# Patient Record
Sex: Female | Born: 1970 | Race: White | Hispanic: No | Smoking: Former smoker
Health system: Southern US, Community
[De-identification: ages and names within clinical notes are randomized; demographics above are authoritative.]

## PROBLEM LIST (undated history)

## (undated) DIAGNOSIS — R569 Unspecified convulsions: Secondary | ICD-10-CM

## (undated) DIAGNOSIS — C50919 Malignant neoplasm of unspecified site of unspecified female breast: Secondary | ICD-10-CM

## (undated) DIAGNOSIS — F32A Depression, unspecified: Secondary | ICD-10-CM

## (undated) DIAGNOSIS — F419 Anxiety disorder, unspecified: Secondary | ICD-10-CM

## (undated) DIAGNOSIS — R51 Headache: Secondary | ICD-10-CM

## (undated) DIAGNOSIS — D649 Anemia, unspecified: Secondary | ICD-10-CM

## (undated) DIAGNOSIS — R112 Nausea with vomiting, unspecified: Secondary | ICD-10-CM

## (undated) DIAGNOSIS — Z9889 Other specified postprocedural states: Secondary | ICD-10-CM

## (undated) DIAGNOSIS — T4145XA Adverse effect of unspecified anesthetic, initial encounter: Secondary | ICD-10-CM

## (undated) DIAGNOSIS — K219 Gastro-esophageal reflux disease without esophagitis: Secondary | ICD-10-CM

## (undated) DIAGNOSIS — T8859XA Other complications of anesthesia, initial encounter: Secondary | ICD-10-CM

## (undated) DIAGNOSIS — F329 Major depressive disorder, single episode, unspecified: Secondary | ICD-10-CM

## (undated) DIAGNOSIS — N6452 Nipple discharge: Secondary | ICD-10-CM

## (undated) HISTORY — PX: COLONOSCOPY: SHX174

## (undated) HISTORY — DX: Nipple discharge: N64.52

## (undated) HISTORY — DX: Unspecified convulsions: R56.9

## (undated) HISTORY — PX: UPPER GASTROINTESTINAL ENDOSCOPY: SHX188

## (undated) HISTORY — PX: MASTECTOMY: SHX3

## (undated) HISTORY — DX: Depression, unspecified: F32.A

## (undated) HISTORY — PX: ABDOMINAL HYSTERECTOMY: SHX81

## (undated) HISTORY — DX: Malignant neoplasm of unspecified site of unspecified female breast: C50.919

## (undated) HISTORY — PX: OTHER SURGICAL HISTORY: SHX169

## (undated) HISTORY — DX: Anxiety disorder, unspecified: F41.9

## (undated) HISTORY — DX: Major depressive disorder, single episode, unspecified: F32.9

## (undated) HISTORY — DX: Headache: R51

---

## 1991-06-04 HISTORY — PX: TUBAL LIGATION: SHX77

## 2011-11-28 ENCOUNTER — Other Ambulatory Visit: Payer: Self-pay | Admitting: Family Medicine

## 2011-11-28 DIAGNOSIS — N6313 Unspecified lump in the right breast, lower outer quadrant: Secondary | ICD-10-CM

## 2011-11-28 DIAGNOSIS — Z803 Family history of malignant neoplasm of breast: Secondary | ICD-10-CM

## 2011-12-04 ENCOUNTER — Ambulatory Visit
Admission: RE | Admit: 2011-12-04 | Discharge: 2011-12-04 | Disposition: A | Discharge status: Home / Self Care | Payer: No Typology Code available for payment source | Source: Ambulatory Visit | Attending: Family Medicine | Admitting: Family Medicine

## 2011-12-04 ENCOUNTER — Other Ambulatory Visit: Payer: Self-pay | Admitting: Family Medicine

## 2011-12-04 DIAGNOSIS — N6313 Unspecified lump in the right breast, lower outer quadrant: Secondary | ICD-10-CM

## 2011-12-04 DIAGNOSIS — Z803 Family history of malignant neoplasm of breast: Secondary | ICD-10-CM

## 2011-12-19 ENCOUNTER — Other Ambulatory Visit: Payer: Self-pay | Admitting: Obstetrics and Gynecology

## 2011-12-19 DIAGNOSIS — Z803 Family history of malignant neoplasm of breast: Secondary | ICD-10-CM

## 2011-12-19 DIAGNOSIS — N6313 Unspecified lump in the right breast, lower outer quadrant: Secondary | ICD-10-CM

## 2012-01-01 ENCOUNTER — Ambulatory Visit
Admission: RE | Admit: 2012-01-01 | Discharge: 2012-01-01 | Disposition: A | Discharge status: Home / Self Care | Payer: No Typology Code available for payment source | Source: Ambulatory Visit | Attending: Obstetrics and Gynecology | Admitting: Obstetrics and Gynecology

## 2012-01-01 ENCOUNTER — Other Ambulatory Visit: Payer: Self-pay

## 2012-01-01 DIAGNOSIS — Z803 Family history of malignant neoplasm of breast: Secondary | ICD-10-CM

## 2012-01-01 DIAGNOSIS — N6313 Unspecified lump in the right breast, lower outer quadrant: Secondary | ICD-10-CM

## 2012-01-01 DIAGNOSIS — N6320 Unspecified lump in the left breast, unspecified quadrant: Secondary | ICD-10-CM

## 2012-01-03 ENCOUNTER — Other Ambulatory Visit: Payer: Self-pay | Admitting: *Deleted

## 2012-01-03 ENCOUNTER — Other Ambulatory Visit: Payer: Self-pay | Admitting: Oncology

## 2012-01-03 DIAGNOSIS — C50419 Malignant neoplasm of upper-outer quadrant of unspecified female breast: Secondary | ICD-10-CM | POA: Insufficient documentation

## 2012-01-03 DIAGNOSIS — C50912 Malignant neoplasm of unspecified site of left female breast: Secondary | ICD-10-CM

## 2012-01-06 ENCOUNTER — Telehealth: Payer: Self-pay | Admitting: *Deleted

## 2012-01-06 NOTE — Telephone Encounter (Signed)
Confirmed [facility] for 01/08/12 at 0830 .  Instructions and contact information given.

## 2012-01-07 ENCOUNTER — Ambulatory Visit (HOSPITAL_COMMUNITY)
Admission: RE | Admit: 2012-01-07 | Discharge: 2012-01-07 | Disposition: A | Discharge status: Home / Self Care | Payer: Medicaid Other | Source: Ambulatory Visit | Attending: Oncology | Admitting: Oncology

## 2012-01-07 ENCOUNTER — Other Ambulatory Visit: Payer: Self-pay | Admitting: Oncology

## 2012-01-07 DIAGNOSIS — Z803 Family history of malignant neoplasm of breast: Secondary | ICD-10-CM | POA: Insufficient documentation

## 2012-01-07 DIAGNOSIS — R928 Other abnormal and inconclusive findings on diagnostic imaging of breast: Secondary | ICD-10-CM

## 2012-01-07 DIAGNOSIS — C50419 Malignant neoplasm of upper-outer quadrant of unspecified female breast: Secondary | ICD-10-CM | POA: Insufficient documentation

## 2012-01-07 DIAGNOSIS — N63 Unspecified lump in unspecified breast: Secondary | ICD-10-CM | POA: Insufficient documentation

## 2012-01-07 DIAGNOSIS — C50912 Malignant neoplasm of unspecified site of left female breast: Secondary | ICD-10-CM

## 2012-01-07 MED ORDER — GADOBENATE DIMEGLUMINE 529 MG/ML IV SOLN
10.0000 mL | Freq: Once | INTRAVENOUS | Status: AC | PRN
  Administered 2012-01-07: 10 mL via INTRAVENOUS

## 2012-01-08 ENCOUNTER — Encounter: Payer: Self-pay | Admitting: Genetic Counselor

## 2012-01-08 ENCOUNTER — Ambulatory Visit (HOSPITAL_BASED_OUTPATIENT_CLINIC_OR_DEPARTMENT_OTHER): Payer: Self-pay | Admitting: Surgery

## 2012-01-08 ENCOUNTER — Ambulatory Visit
Admission: RE | Admit: 2012-01-08 | Discharge: 2012-01-08 | Disposition: A | Discharge status: Home / Self Care | Payer: Medicaid Other | Source: Ambulatory Visit | Attending: Radiation Oncology | Admitting: Radiation Oncology

## 2012-01-08 ENCOUNTER — Ambulatory Visit (HOSPITAL_BASED_OUTPATIENT_CLINIC_OR_DEPARTMENT_OTHER): Payer: Self-pay | Admitting: Oncology

## 2012-01-08 ENCOUNTER — Ambulatory Visit: Payer: Self-pay

## 2012-01-08 ENCOUNTER — Encounter: Payer: Self-pay | Admitting: *Deleted

## 2012-01-08 ENCOUNTER — Ambulatory Visit: Payer: Medicaid Other | Attending: Surgery | Admitting: Physical Therapy

## 2012-01-08 ENCOUNTER — Encounter (INDEPENDENT_AMBULATORY_CARE_PROVIDER_SITE_OTHER): Payer: Self-pay | Admitting: Surgery

## 2012-01-08 ENCOUNTER — Other Ambulatory Visit (HOSPITAL_BASED_OUTPATIENT_CLINIC_OR_DEPARTMENT_OTHER): Payer: Self-pay | Admitting: Lab

## 2012-01-08 ENCOUNTER — Other Ambulatory Visit: Payer: Self-pay | Admitting: Lab

## 2012-01-08 ENCOUNTER — Ambulatory Visit (HOSPITAL_BASED_OUTPATIENT_CLINIC_OR_DEPARTMENT_OTHER): Payer: Self-pay | Admitting: Genetic Counselor

## 2012-01-08 VITALS — BP 97/63 | HR 86 | Temp 98.6°F | Resp 20 | Ht 62.0 in | Wt 115.0 lb

## 2012-01-08 VITALS — BP 97/63 | HR 80 | Temp 98.9°F | Resp 20 | Ht 62.0 in | Wt 115.1 lb

## 2012-01-08 DIAGNOSIS — R293 Abnormal posture: Secondary | ICD-10-CM | POA: Insufficient documentation

## 2012-01-08 DIAGNOSIS — Z170 Estrogen receptor positive status [ER+]: Secondary | ICD-10-CM

## 2012-01-08 DIAGNOSIS — C50419 Malignant neoplasm of upper-outer quadrant of unspecified female breast: Secondary | ICD-10-CM

## 2012-01-08 DIAGNOSIS — M25619 Stiffness of unspecified shoulder, not elsewhere classified: Secondary | ICD-10-CM | POA: Insufficient documentation

## 2012-01-08 DIAGNOSIS — C50919 Malignant neoplasm of unspecified site of unspecified female breast: Secondary | ICD-10-CM | POA: Insufficient documentation

## 2012-01-08 DIAGNOSIS — F411 Generalized anxiety disorder: Secondary | ICD-10-CM

## 2012-01-08 DIAGNOSIS — IMO0002 Reserved for concepts with insufficient information to code with codable children: Secondary | ICD-10-CM

## 2012-01-08 DIAGNOSIS — IMO0001 Reserved for inherently not codable concepts without codable children: Secondary | ICD-10-CM | POA: Insufficient documentation

## 2012-01-08 DIAGNOSIS — Z9010 Acquired absence of unspecified breast and nipple: Secondary | ICD-10-CM

## 2012-01-08 LAB — COMPREHENSIVE METABOLIC PANEL
ALT: 10 U/L (ref 0–35)
CO2: 25 mEq/L (ref 19–32)
Calcium: 9.1 mg/dL (ref 8.4–10.5)
Chloride: 103 mEq/L (ref 96–112)
Creatinine, Ser: 0.75 mg/dL (ref 0.50–1.10)
Glucose, Bld: 87 mg/dL (ref 70–99)

## 2012-01-08 LAB — CBC WITH DIFFERENTIAL/PLATELET
BASO%: 0.5 % (ref 0.0–2.0)
Basophils Absolute: 0 10*3/uL (ref 0.0–0.1)
Eosinophils Absolute: 0.1 10*3/uL (ref 0.0–0.5)
HCT: 35.6 % (ref 34.8–46.6)
HGB: 11.9 g/dL (ref 11.6–15.9)
MCHC: 33.3 g/dL (ref 31.5–36.0)
MONO#: 0.4 10*3/uL (ref 0.1–0.9)
NEUT#: 5.2 10*3/uL (ref 1.5–6.5)
NEUT%: 70.4 % (ref 38.4–76.8)
Platelets: 178 10*3/uL (ref 145–400)
WBC: 7.4 10*3/uL (ref 3.9–10.3)
lymph#: 1.7 10*3/uL (ref 0.9–3.3)

## 2012-01-08 LAB — CANCER ANTIGEN 27.29: CA 27.29: 18 U/mL (ref 0–39)

## 2012-01-08 NOTE — Progress Notes (Signed)
Patient ID: Kerry Travis, female   DOB: 02-May-1971, 41 y.o.   MRN: [id number]  Chief Complaint  Patient presents with  . Breast Cancer    Left    HPI Kerry Travis is a 41 y.o. female.  This patient felt a lump and was having some pain in her right breast. She was evaluated with mammograms and ultrasound. The area in the right breast turned out to be fibrocystic change with cysts. However a spiculated mass in the left breast a biopsy has shown receptor positive invasive ductal carcinoma. MRI has shown some other lesions in both breasts appear to be benign fibroadenomas. They have not been biopsied. She comes to the breast multidisciplinary clinic today for evaluation. She is very interested in having bilateral mastectomies with reconstructions. She has had chronic issues with pain in both breasts. She has had biopsies in the past have shown benign disease. Her mother developed breast cancer around age 34 and died from this disease. There are other family members with breast cancer. HPI  Past Medical History  Diagnosis Date  . Breast cancer   . Seizures   . Anxiety   . Depression   . Headache   . Nipple discharge     Past Surgical History  Procedure Date  . Cesarean section     1989, 1990, 1992  . Tubal ligation 1993    No family history on file.  Social History History  Substance Use Topics  . Smoking status: Current Everyday Smoker -- 1.0 packs/day  . Smokeless tobacco: Not on file  . Alcohol Use: No    No Known Allergies  Current Outpatient Prescriptions  Medication Sig Dispense Refill  . clonazePAM (KLONOPIN) 0.5 MG tablet Take 0.5 mg by mouth 2 (two) times daily as needed.      . dimenhyDRINATE (DRAMAMINE) 50 MG tablet Take 50 mg by mouth as needed.      . diphenhydrAMINE (SOMINEX) 25 MG tablet Take 25 mg by mouth at bedtime as needed.      Marland Kitchen ibuprofen (ADVIL,MOTRIN) 100 MG tablet Take 100 mg by mouth every 6 (six) hours as needed.        Review of Systems Review of  Systems  Constitutional: Positive for chills. Negative for fever and unexpected weight change.  HENT: Positive for congestion. Negative for hearing loss, sore throat, trouble swallowing and voice change.   Eyes: Negative for visual disturbance.  Respiratory: Negative for cough and wheezing.   Cardiovascular: Negative for chest pain, palpitations and leg swelling.  Gastrointestinal: Negative for nausea, vomiting, abdominal pain, diarrhea, constipation, blood in stool, abdominal distention and anal bleeding.  Genitourinary: Negative for hematuria, vaginal bleeding and difficulty urinating.  Musculoskeletal: Negative for arthralgias.  Skin: Negative for rash and wound.  Neurological: Positive for seizures and headaches. Negative for syncope.  Hematological: Negative for adenopathy. Does not bruise/bleed easily.  Psychiatric/Behavioral: Negative for confusion.       Has had issues with anxiety and depression as well as phobias    Blood pressure 97/63, pulse 86, temperature 98.6 F (37 C), resp. rate 20, height 5\' 2"  (1.575 m), weight 115 lb (52.164 kg), last menstrual period 12/18/2011.  Physical Exam Physical Exam  Vitals reviewed. Constitutional: She is oriented to person, place, and time. She appears well-developed and well-nourished. No distress.  HENT:  Head: Normocephalic and atraumatic.  Mouth/Throat: Oropharynx is clear and moist.  Eyes: Conjunctivae and EOM are normal. Pupils are equal, round, and reactive to light. No scleral icterus.  Neck: Normal range of motion. Neck supple. No tracheal deviation present. No thyromegaly present.  Cardiovascular: Normal rate, regular rhythm, normal heart sounds and intact distal pulses.  Exam reveals no gallop and no friction rub.   No murmur heard. Pulmonary/Chest: Effort normal and breath sounds normal. No respiratory distress. She has no wheezes. She has no rales.       The breasts are bilaterally dense, irregular, and tender. There is an  ecchymosis in the upper outer quadrant of the left breast with what I think is some hematoma, not cancer. There no skin nipple or areolar changes noted.  Abdominal: Soft. Bowel sounds are normal. She exhibits no distension and no mass. There is no tenderness. There is no rebound and no guarding.  Musculoskeletal: Normal range of motion. She exhibits no edema and no tenderness.  Lymphadenopathy:    She has no cervical adenopathy.    She has no axillary adenopathy.  Neurological: She is alert and oriented to person, place, and time.  Skin: Skin is warm and dry. No rash noted. She is not diaphoretic. No erythema.  Psychiatric: She has a normal mood and affect. Her behavior is normal. Judgment and thought content normal.    Data Reviewed I have reviewed the mammogram and MRI films and reports with her radiologist and the pathology slides and reports for the pathologist. I reviewed the case with the medical and radiation oncologist as well  Assessment    Left breast cancer, upper outer quadrant, receptor positive Strong family history of breast cancer Fibrocystic breast disease    Plan I have explained the pathophysiology and staging of breast cancer with particular attention to her exact situation. We discussed the multidisciplinary approach to breast cancer which often includes both medical and radiation oncology consultations.  We also discussed surgical options for the treatment of breast cancer including lumpectomy and mastectomy with possible reconstructive surgery. In addition we talked about the evaluation and management of lymph nodes including a description of sentinel lymph node biopsy and axillary dissections. We reviewed potential complications and risks including bleeding, infection, numbness,  lymphedema, and the potential need for additional surgery.  She understands that for patients who are candidate for lumpectomy or mastectomy there is an equal survival rate with either  technique, but a slightly higher local recurrence rate with lumpectomy. In addition she knows that a lumpectomy usually requires postoperative radiation as part of the management of the breast cancer.  We have discussed the likely postoperative course and plans for followup.  I have given the patient some written information that reviewed all of these issues. I believe her questions are answered and that she has a good understanding of the issues.     I think the patient could be treated with a lumpectomy and sentinel node evaluation.However she is much more in favor of a mastectomy with reconstruction for breast cancer and a prophylactic mastectomy on the right side given her history of discomfort are cystic changes and strong family breast cancer history. Current  She is scheduled to see a genetic counselor this morning for her evaluation and to see a plastic surgeon this afternoon for discussion of reconstruction options and potential. Of note is that she is a smoker and I discussed that with her advised her to quit, especially given that this Might adversely affect her ability to have reconstructive surgery.       Migel Hannis J 01/08/2012, 11:35 AM

## 2012-01-08 NOTE — Progress Notes (Signed)
[facility] Radiation Oncology NEW PATIENT EVALUATION  Name: Kerry Travis MRN: [id number]  Date:   01/08/2012           DOB: [birth year]  Status: outpatient   CC: No primary provider on file.  Streck, Reola Mosher, MD    REFERRING PHYSICIAN: Streck, Reola Mosher, MD   DIAGNOSIS: Clinical stage I (T1, N0, M0) invasive ductal carcinoma of the left breast   HISTORY OF PRESENT ILLNESS:  Kerry Travis is a 41 y.o. female who is seen today the [facility]. of for evaluation of her clinical stage I (T1, N0, M0) invasive ductal carcinoma of the left breast. She has a history of bilateral breast cysts and visited Dr. Nathanial Rancher for evaluation. She was felt to have a mass along the upper-outer quadrant of the left breast and was [facility]. Her breast tissue is heterogeneously dense and her 2 additional masses and 9:00 position of the right breast posteriorly. There were multiple circumscribed masses scattered throughout both breasts. There was felt to be a spiculated mass in the upper outer quadrant of left breast containing fine microcalcifications measuring 0.8 x 1.0 x 1.0 cm. On palpation there was a 2 cm oval mass at 9:00, 7 cm from the right nipple but no palpable left breast mass. Ultrasound showed a cyst at 9:00 along the right breast and other cysts scattered throughout both breasts. There was an irregular mass at 1:30 with a left breast, 5 cm from left nipple measure 1.0 x 0.9 x 0.9 cm. This corresponded to the spiculated mass seen on mammography. Ultrasound-guided core biopsy on 01/01/2012 was diagnostic for invasive ductal carcinoma, hormone receptor positive and felt to be low-grade. Breast MR on 01/07/2012 showed a dominant left breast mass at 1:30 with numerous of enhancing masses in both breasts, presumably benign although a second look ultrasound was recommended for the dominant masses the left lower outer quadrant and right lower outer inner  quadrant. She seen today with Dr. Jamey Ripa and Dr. Donnie Coffin.  PREVIOUS RADIATION THERAPY: No   PAST MEDICAL HISTORY:  has a past medical history of Breast cancer; Seizures; Anxiety; Depression; Headache; and Nipple discharge.     PAST SURGICAL HISTORY:  Past Surgical History  Procedure Date  . Cesarean section     1989, 1990, 1992  . Tubal ligation 1993     FAMILY HISTORY: family history includes Breast cancer in her brother and maternal grandmother and Breast cancer (age of onset:56) in her mother. Her mother died at age 72 from metastatic breast cancer. Divorced, 3 children (2 daughters). 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   SOCIAL HISTORY:  reports that she has been smoking.  She does not have any smokeless tobacco history on file. She reports that she does not drink alcohol or use illicit drugs.   ALLERGIES: Review of patient's allergies indicates no known allergies.   MEDICATIONS:  Current Outpatient Prescriptions  Medication Sig Dispense Refill  . clonazePAM (KLONOPIN) 0.5 MG tablet Take 0.5 mg by mouth 2 (two) times daily as needed.      . dimenhyDRINATE (DRAMAMINE) 50 MG tablet Take 50 mg by mouth as needed.      . diphenhydrAMINE (SOMINEX) 25 MG tablet Take 25 mg by mouth at bedtime as needed.      Marland Kitchen ibuprofen (ADVIL,MOTRIN) 100 MG tablet Take 100 mg by mouth every 6 (six) hours as needed.         REVIEW  OF SYSTEMS:  Pertinent items are noted in HPI.    PHYSICAL EXAM: Alert and anxious 41 year old white female appearing her stated age. Wt Readings from Last 3 Encounters:  01/08/12 115 lb (52.164 kg)  01/08/12 115 lb 1.6 oz (52.209 kg)   Temp Readings from Last 3 Encounters:  01/08/12 98.6 F (37 C)   01/08/12 98.9 F (37.2 C) Oral   BP Readings from Last 3 Encounters:  01/08/12 97/63  01/08/12 97/63   Pulse Readings from Last 3 Encounters:  01/08/12 86  01/08/12 80   Head and neck examination: Grossly unremarkable. Nodes: Without palpable  cervical, supraclavicular, or axillary lymphadenopathy. Chest: Lungs clear. Back: Without spinal or CVA tenderness. Heart: Regular rate and rhythm. Breasts: There is a to send her oval mass palpable at 9:00 along the lateral aspect of the right breast corresponding to a cyst. Along the left breast is a biopsy wound at 2:00 with surrounding induration and ecchymosis. Extremities: Without edema. Neurologic examination: Grossly nonfocal.   LABORATORY DATA:  Lab Results  Component Value Date   WBC 7.4 01/08/2012   HGB 11.9 01/08/2012   HCT 35.6 01/08/2012   MCV 92.0 01/08/2012   PLT 178 01/08/2012   Lab Results  Component Value Date   NA 136 01/08/2012   K 3.9 01/08/2012   CL 103 01/08/2012   CO2 25 01/08/2012   Lab Results  Component Value Date   ALT 10 01/08/2012   AST 13 01/08/2012   ALKPHOS 78 01/08/2012   BILITOT 0.1* 01/08/2012      IMPRESSION: Clinical stage I (T1, N0, M0) invasive ductal carcinoma of the left breast. In general terms, we talked about breast preservation versus mastectomy. She would obviously be difficult to follow and she would need further diagnostic evaluation to rule out multicentric disease. After lengthy discussion she is most interested in having a left mastectomy/sentinel lymph node biopsy and prophylactic right mastectomy particularly in view of her family history and her young age. She will undergo genetic counseling, having 2 daughters, and he referred to plastic surgery. We spent some time discussing the role of radiation therapy in the management of breast cancer, and I think it would be unlikely for her to require post mastectomy radiation, and therefore she may be a candidate for immediate reconstruction.   PLAN: As discussed above.   I spent 40 minutes minutes face to face with the patient and more than 50% of that time was spent in counseling and/or coordination of care.

## 2012-01-08 NOTE — Progress Notes (Signed)
Dr.  Donnie Coffin requested a consultation for genetic counseling and risk assessment for Kerry Travis, a 41 y.o. female, for discussion of her personal and family history of breast cancer. She presents to clinic today, with her boyfriend, to discuss the possibility of a genetic predisposition to cancer, and to further clarify her risks, as well as her family members' risks for cancer.   HISTORY OF PRESENT ILLNESS: In August 2013, at the age of 12, Kerry Travis was diagnosed with invasive ductal carcinoma of the left breast. This will be treated with surgery and possible radiation.    Past Medical History  Diagnosis Date  . Breast cancer   . Seizures   . Anxiety   . Depression   . Headache   . Nipple discharge     Past Surgical History  Procedure Date  . Cesarean section     1989, 1990, 1992  . Tubal ligation 1993    History  Substance Use Topics  . Smoking status: Current Everyday Smoker -- 1.0 packs/day for 20 years  . Smokeless tobacco: Not on file  . Alcohol Use: No    REPRODUCTIVE HISTORY AND PERSONAL RISK ASSESSMENT FACTORS: Menarche was at age 73.   Premenopausal Uterus Intact: Yes Ovaries Intact: Yes [id number] , first live birth at age 71  She has not previously undergone treatment for infertility.   OCP use for about 1 year   She has not used HRT in the past.    FAMILY HISTORY:  We obtained a detailed, 4-generation family history.  Significant diagnoses are listed below: Family History  Problem Relation Age of Onset  . Breast cancer Mother 79    died 3 months after diagnosis  . Breast cancer Brother     diagnosed under the age of 38  . Breast cancer Maternal Grandmother     diagnosed over the age of 67  The patient was diagnosed with breast cancer at age 27.  Her mother was diagnosed with breast cancer at age 52 and died at age 48.  She has three maternal aunts and one maternal uncle.  One aunt died of breast cancer.  The patient's maternal grandmother was also  diagnosed with breast cancer.  There is no information about the paternal side of the family.  Patient's maternal ancestors are of unknown descent, and paternal ancestors are of [place] descent. There is no reported Ashkenazi Jewish ancestry. There is no known consanguinity.  GENETIC COUNSELING RISK ASSESSMENT, DISCUSSION, AND SUGGESTED FOLLOW UP: We reviewed the natural history and genetic etiology of sporadic, familial and hereditary cancer syndromes.  About 5-10% of breast cancer is hereditary.  Of this, about 85% is the result of a BRCA1 or BRCA2 mutation.  We reviewed the red flags of hereditary cancer syndromes and the dominant inheritance patterns.  If the BRCA testing is negative, we discussed that we could be testing for the wrong gene.  We discussed gene panels, and that several cancer genes that are associated with different cancers can be tested at the same time.  Because of the patient's insurance status, we have proceeded with genetic testing through [facility]'s financial assistance program.  The patient's personal and family history is suggestive of the following possible diagnosis: hereditary breast cancer  We discussed that identification of a hereditary cancer syndrome may help her care providers tailor the patients medical management. If a mutation indicating a hereditary breast cancer is detected in this case, the [employer] recommendations would include increased cancer surveillance  and possible prophylactic surgery. If a mutation is detected, the patient will be referred back to the referring provider and to any additional appropriate care providers to discuss the relevant options.   If a mutation is not found in the patient, this will decrease the likelihood of a hereditary breast cancer as the explanation for her breast cancer. Cancer surveillance options would be discussed for the patient according to the appropriate standard National Comprehensive Cancer  Network and American Cancer Society guidelines, with consideration of their personal and family history risk factors. In this case, the patient will be referred back to their care providers for discussions of management.   In order to estimate her chance of having a BRCA1 or BRCA2 mutation, we used statistical models (Penn II) and laboratory data that take into account her personal medical history, family history and ancestry.  Because each model is different, there can be a lot of variability in the risks they give.  Therefore, these numbers must be considered a rough range and not a precise risk of having a BRCA1 or BRCA2 mutation.  These models estimate that she has approximately a 12% chance of having a mutation. Based on this assessment of her family and personal history, genetic testing is recommended.  After considering the risks, benefits, and limitations, the patient provided informed consent for  the following  testing: [facility] through [employer] [facility].   Per the patient's request, we will contact her by telephone to discuss these results. A follow up genetic counseling visit will be scheduled if indicated.  The patient was seen for a total of 60 minutes, greater than 50% of which was spent face-to-face counseling.  This plan is being carried out per Dr. Renelda Loma recommendations.  This note will also be sent to the referring provider via the electronic medical record. The patient will be supplied with a summary of this genetic counseling discussion as well as educational information on the discussed hereditary cancer syndromes following the conclusion of their visit.   Patient was discussed with Dr. Drue Second.   _______________________________________________________________________ For Office Staff:  Number of people involved in session: 3 Was an Intern/ student involved with case: not applicable

## 2012-01-08 NOTE — Progress Notes (Signed)
Kerry Travis [id number] 01/14/1971 40 y.o. 01/08/2012 12:39 PM  CC   REASON FOR CONSULTATION:    Patient was seen in the Multidisciplinary Breast Clinic for discussion of her treatment options. She was seen by Dr. Pierce Crane, Radiation Onc[facility]  STAGE:   Cancer of upper-outer quadrant of female breast   Primary site: Breast (Left)   Staging method: AJCC 7th Edition   Clinical: Stage IA (T1c, N0, cM0)   Summary: Stage IA (T1c, N0, cM0)  REFERRING PHYSICIAN [facility]  HISTORY OF PRESENT ILLNESS:  Kerry Travis is a 41 y.o. female.  For men of a [place] who presents with an abnormal mammogram and breast cancer. She has a long-standing history of cystic breast disease. She has had previous discharge in the right breast for many as well as benign biopsy from the left breast number of years ago when she was living in [place]. She is complaining of painful cyst right breast and had a mammogram performed. She underwent diagnostic bilateral mammogram and bilateral ultrasound 12/04/2011. 2 adjacent masses that o'clock position right breast were seen there were scattered by circumscribed masses in both breasts. There is a spiculated mass left upper outer quadrant. A palpable mass was detected at 9:00 position 7 cm from the nipple. This corresponds to a cyst on ultrasound. A subsequent mass in the left upper outer quadrant was seen in the 1:30 position and was in fact biopsy on 01/01/2012. 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  Past Medical History: Past Medical History  Diagnosis Date  . Breast cancer   . Seizures  for started age 61 she has an every 2-3 months. They're poorly characterized. She was previously tried on Depakote which she discontinued. Last seizure was about 3 months ago. She has had a fairly severe seizures characterized by  nausea vomiting as well as syncope and hypotension.   Marland Kitchen Anxiety   . Depression   . Headache   . Nipple discharge     Past Surgical History: Past Surgical History  Procedure Date  . Cesarean section     1989, 1990, 1992  . Tubal ligation 1993    Family History: She has had multiple siblings all of whom are essentially half siblings . Mother had breast cancer age 13 and died of this. Paternal aunt and grandmother also had breast cancer at unknown ages. Social History History  Substance Use Topics  . Smoking status: Current Everyday Smoker -- 1.0 packs/day and has Travis so for approximately a 15 years.   . Smokeless tobacco: Not on file  . Alcohol Use: No   Patient has had a dramatic childhood and adolescence. She was married at age of 58 and has children ages 32 and subsequent to that. She was in a religious called environment and eventually escaped with her 2 young children. She is now living in [place] with her significant other who has a fairly well-established and stable relationship. Allergies: No Known Allergies  Current Medications: Current Outpatient Prescriptions  Medication Sig Dispense Refill  . clonazePAM (KLONOPIN) 0.5 MG tablet Take 0.5 mg by mouth 2 (two) times daily as needed.      . dimenhyDRINATE (DRAMAMINE) 50 MG tablet Take 50 mg by mouth as needed.      . diphenhydrAMINE (SOMINEX) 25 MG tablet Take 25 mg by mouth at bedtime as needed.      Marland Kitchen  ibuprofen (ADVIL,MOTRIN) 100 MG tablet Take 100 mg by mouth every 6 (six) hours as needed.        OB/GYN History: G3 P3, menarche age 30, continues to have normal menses. Has not taken any birth control pills.  Fertility Discussion: No Prior History of Cancer: No  Health Maintenance:  Colonoscopyno Bone Densityno Last PAP smear 2011   ECOG PERFORMANCE STATUS: 0 - Asymptomatic  Genetic Counseling/testing: Currently in progress  REVIEW OF SYSTEMS:  Pertinent items are noted in HPI. She has difficulty  sleeping and is has ongoing anxiety secondary to a PTSD-type situation. She has problems with constipation. She denies any other problems currently. As noted no recent seizures in about 3 months.  PHYSICAL EXAMINATION: Blood pressure 97/63, pulse 80, temperature 98.9 F (37.2 C), temperature source Oral, resp. rate 20, height 5\' 2"  (1.575 m), weight 115 lb 1.6 oz (52.209 kg), last menstrual period 12/18/2011.   HEENT:  Sclerae anicteric, conjunctivae pink.  Oropharynx clear.  No mucositis or candidiasis.  Nodes:  No cervical, supraclavicular, or axillary lymphadenopathy palpated.  Breast Exam:  Right breast has a palpable mass in the upper-outer quadrant.   Left breast is benign.  No masses, discharge, skin change, or nipple inversion..  Lungs:  Clear to auscultation bilaterally.  No crackles, rhonchi, or wheezes.  Heart:  Regular rate and rhythm.  Abdomen:  Soft, nontender.  Positive bowel sounds.  No organomegaly or masses palpated.  Musculoskeletal:  No focal spinal tenderness to palpation.  Extremities:  Benign.  No peripheral edema or cyanosis.  Skin:  Benign.  Neuro:  Nonfocal.     STUDIES/RESULTS: Mr Breast Bilateral W Wo Contrast  01/07/2012  *RADIOLOGY REPORT*  Clinical Data: Newly-diagnosed left breast 1:30 location invasive ductal carcinoma.  Maternal history of breast cancer, diseased at age 40.  History of breast cancer in the patient's grandmother and maternal aunt.  BILATERAL BREAST MRI WITH AND WITHOUT CONTRAST  Technique: Multiplanar, multisequence MR images of both breasts were obtained prior to and following the intravenous administration of 10ml of Multihance.  Three dimensional images were evaluated at the independent DynaCad workstation.  Comparison:  Prior mammograms and ultrasound at [facility] 12/04/2011 and 01/01/2012  Findings: Scattered T2 hyperintense nonenhancing cysts are noted bilaterally.  No abnormal area of T2-weighted hyperintensity or  lymphadenopathy is seen on either side.  In the left breast 1:30 location, there is an irregular enhancing mass with plateau type enhancement kinetics measuring 2.0 x 1.3 x 1.0 cm.  This contains internal clip artifact.  This corresponds to the biopsy-proven breast cancer.  There is a moderate parenchymal type enhancement pattern noted bilaterally, which may decrease the sensitivity for detection of malignancy.  There are oval homogeneously enhancing T2 isointense masses of varying sizes in both breasts elsewhere, demonstrating plateau type enhancement kinetics, with the largest in the left breast lower outer quadrant measuring 7 mm maximally.  In the right breast lower inner quadrant, the dominant mass measures 1.7 cm in maximal diameter and a subareolar right lower inner quadrant mass measures 1.1 cm in maximal diameter.  These are 4 cm apart in anteroposterior dimension.  IMPRESSION: Dominant irregular enhancing left breast mass 1:30 location with internal clip artifact, corresponding to the biopsy-proven breast cancer.  Numerous oval enhancing masses in both breasts, with probably benign imaging features, although a second look ultrasound is recommended for the dominant masses in the left lower outer quadrant and right lower inner quadrant, respectively, to determine whether ultrasound guided core  biopsy should be performed or whether the patient is a candidate for short-term follow-up. Unless there is a pathological proof of multifocal/multicentric malignancy for which mastectomy is planned, 40-month follow-up breast MRI with contrast would be recommended for surveillance of the smaller probably benign masses bilaterally elsewhere.  RECOMMENDATION: Second look ultrasound, bilateral breasts  51-month follow-up breast MRI  BI-RADS CATEGORY 6:  Known biopsy-proven malignancy - appropriate action should be taken.  We will notify the patient and arrange for sonographic follow-up.  THREE-DIMENSIONAL MR IMAGE RENDERING  ON INDEPENDENT WORKSTATION:  Three-dimensional MR images were rendered by post-processing of the original MR data on an independent workstation.  The three- dimensional MR images were interpreted, and findings were reported in the accompanying complete MRI report for this study.  Original Report Authenticated By: Harrel Lemon, M.D.   [place] Core Biopsy  01/02/2012  **ADDENDUM** CREATED: 01/02/2012 16:03:15  Biopsy results reveal invasive ductal carcinoma and fibrocystic change.  This is concordant with the imaging findings.  Results were discussed the patient by telephone at 03:30 p.m. The patient will be seen at Multidisciplinary Clinic on August 7. A breast MRI is recommended.  The patient states the biopsy site is healing well without significant pain or bruising.  She is instructed call the Breast Center with any further questions or concerns.  **END ADDENDUM** SIGNED BY: Hiram Gash, M.D.   01/01/2012  *RADIOLOGY REPORT*  Clinical Data:  Irregular 9 x 10 x 9 mm mass at 1:30 o'clock, 5 cm from the left nipple.  ULTRASOUND GUIDED CORE BIOPSY OF THE left BREAST  The patient and I discussed the procedure of ultrasound-guided biopsy, including benefits and alternatives.  We discussed the high likelihood of a successful procedure. We discussed the risks of the procedure, including infection, bleeding, tissue injury, clip migration, and inadequate sampling.  Informed written consent was given.  Using sterile technique, 2% lidocaine, ultrasound guidance and a 14 gauge automated biopsy device, biopsy was performed of the mass at 1:30 o'clock 5 cm from the left nipple using a lateromedial approach.  At the conclusion of the procedure, a ribbon tissue marker clip was deployed into the biopsy cavity.  Follow up 2 view mammogram was performed and dictated separately.  IMPRESSION: Ultrasound guided biopsy of a mass at 1:30 o'clock, 5 cm from the left nipple.  No apparent complications. Original Report Authenticated  By: Hiram Gash, M.D.   Mm Digital Diagnostic Unilat L  01/01/2012  *RADIOLOGY REPORT*  Clinical Data:  Ultrasound-guided core needle biopsy of a mass at 1:30 o'clock 5 cm from the left nipple with clip placement.  DIGITAL DIAGNOSTIC LEFT MAMMOGRAM  Comparison:  Previous exams  Findings:  Films are performed following ultrasound guided biopsy of a mass at 1:30 o'clock 5 cm from the left nipple.  The ribbon clip is appropriately positioned within the mass.  IMPRESSION: Appropriate clip placement following ultrasound-guided core needle biopsy of a mass at 1:30 o'clock, 5 cm from the left nipple.  Original Report Authenticated By: Daryl Eastern, M.D.     LABS:    Chemistry      Component Value Date/Time   NA 136 01/08/2012 0901   K 3.9 01/08/2012 0901   CL 103 01/08/2012 0901   CO2 25 01/08/2012 0901   BUN 15 01/08/2012 0901   CREATININE 0.75 01/08/2012 0901      Component Value Date/Time   CALCIUM 9.1 01/08/2012 0901   ALKPHOS 78 01/08/2012 0901   AST 13 01/08/2012 0901  ALT 10 01/08/2012 0901   BILITOT 0.1* 01/08/2012 0901      Lab Results  Component Value Date   WBC 7.4 01/08/2012   HGB 11.9 01/08/2012   HCT 35.6 01/08/2012   MCV 92.0 01/08/2012   PLT 178 01/08/2012       PATHOLOGY: ER/PR positive, ductal cancer HER-2 negative ER/PR positive  ASSESSMENT    Patient elects to go ahead with bilateral mastectomies. We discussed further discussion after final pathology and likely further evaluation for the possibility of receiving chemotherapy. She will need genetic testing as well. She has ongoing anxiety disorder. I recommend that she see our counselor. I've also recommended that she see a neurologist for evaluation of her seizures  Clinical Trial Eligibility: NA Multidisciplinary conference discussion yes    PLAN:    As above noted that she will have bilateral mastectomies Oncotype testing to genetic testing evaluation by neurology as well as no health therapist. He has ongoing sleep  disorder needs to be evaluated as well.       Discussion: Patient is being treated per NCCN breast cancer care guidelines appropriate for stage I/II   Thank you so much for allowing me to participate in the care of Kerry Travis. I will continue to follow up the patient with you and assist in her care.  All questions were answered. The patient knows to call the clinic with any problems, questions or concerns. We can certainly see the patient much sooner if necessary.  I spent 40 minutes counseling the patient face to face. The total time spent in the appointment was 20 minutes.  Pierce Crane M.D. FRCP C. 01/08/2012, 12:39 PM

## 2012-01-08 NOTE — Progress Notes (Signed)
[facility] Psychosocial Distress Screening Clinical Social Work  Pt completed distress screening protocol, and scored a 10 on the Psychosocial Distress Thermometer which indicates severe distress. Clinical Child psychotherapist met with pt and pt's significant other in [facility] to assess for distress and other psychosocial needs.  Pt expressed feeling overwhelmed and stated "trying to take it all in".  CSW validated the pt's feelings and provided information on available support services.  Pt acknowledged the need for emotional support, but was reserved and said very little.  CSW provided pt with information on the support team and support programs at [facility].  CSW and pt discussed seeing Dr. Noe Gens [facility] psychologist), and pt agreed to referral.  CSW encouraged pt to call with any questions or concerns.    Tamala Julian, MSW, LCSW Clinical Social Worker [facility] [phone]

## 2012-01-08 NOTE — Patient Instructions (Signed)
We will try to schedule surgery after you have seen a plastic surgeon and we can make more definitive plans.

## 2012-01-09 ENCOUNTER — Telehealth: Payer: Self-pay | Admitting: *Deleted

## 2012-01-09 ENCOUNTER — Encounter: Payer: Self-pay | Admitting: Psychiatry

## 2012-01-09 NOTE — Telephone Encounter (Signed)
Spoke to pt at length about [facility]C from 8/7.  Discussed dx and treatment care plan.  Pt request referral to Dr. Noe Gens.  Referral was made by [facility] in [place].  Sent pt resource guide to pt.  Gave emotional support.  Pt had multiple questions on Medicaid.  Spoke to [place] in community outreach to call pt to answer questions concerning Medicaid.  Encourage pt to call with further needs or concerns.  Received verbal understanding.  Contact information given.

## 2012-01-09 NOTE — Telephone Encounter (Signed)
Pt called very upset and crying about plastic surgery waiting to do reconstruction until her Medicaid is active.  I called [facility] coordinator to discuss when Medicaid form was sent out, relate form was sent out today.  I called Dr. Odis Luster office to inform that Medicaid application was sent out today.  Informed pt that gave Dr. Odis Luster office new information on [facility] application.  Gave pt emotion support and encouragement.

## 2012-01-10 ENCOUNTER — Other Ambulatory Visit (INDEPENDENT_AMBULATORY_CARE_PROVIDER_SITE_OTHER): Payer: Self-pay | Admitting: Surgery

## 2012-01-10 DIAGNOSIS — C50912 Malignant neoplasm of unspecified site of left female breast: Secondary | ICD-10-CM

## 2012-01-13 ENCOUNTER — Telehealth: Payer: Self-pay | Admitting: *Deleted

## 2012-01-13 ENCOUNTER — Encounter: Payer: Self-pay | Admitting: *Deleted

## 2012-01-13 NOTE — Telephone Encounter (Signed)
Patient confirmed over the new date and time 03-18-2012 starting at 10:30am

## 2012-01-13 NOTE — Telephone Encounter (Signed)
Patient called me today in regards to needing to speak with Dr. Noe Gens. Patient has high anxiety and has been referred to Dr. Noe Gens. Told patient will give message to Orinda Kenner the Secretary of support services. Let her know that either Steward Drone or Dr. Noe Gens will follow up with her on an appointment. Patient verbalized understanding.   Gave message to Orinda Kenner to give to Dr. Noe Gens to call her.

## 2012-01-14 ENCOUNTER — Encounter: Payer: Self-pay | Admitting: *Deleted

## 2012-01-14 ENCOUNTER — Telehealth: Payer: Self-pay | Admitting: *Deleted

## 2012-01-14 ENCOUNTER — Other Ambulatory Visit: Payer: Self-pay | Admitting: *Deleted

## 2012-01-14 ENCOUNTER — Ambulatory Visit (INDEPENDENT_AMBULATORY_CARE_PROVIDER_SITE_OTHER): Payer: Self-pay | Admitting: Psychiatry

## 2012-01-14 ENCOUNTER — Other Ambulatory Visit: Payer: Self-pay

## 2012-01-14 DIAGNOSIS — F4310 Post-traumatic stress disorder, unspecified: Secondary | ICD-10-CM

## 2012-01-14 DIAGNOSIS — Z7189 Other specified counseling: Secondary | ICD-10-CM

## 2012-01-14 DIAGNOSIS — F17200 Nicotine dependence, unspecified, uncomplicated: Secondary | ICD-10-CM

## 2012-01-14 DIAGNOSIS — F331 Major depressive disorder, recurrent, moderate: Secondary | ICD-10-CM

## 2012-01-14 DIAGNOSIS — C50919 Malignant neoplasm of unspecified site of unspecified female breast: Secondary | ICD-10-CM

## 2012-01-14 MED ORDER — VENLAFAXINE HCL 37.5 MG PO TABS: 37.5000 mg | ORAL_TABLET | Freq: Two times a day (BID) | ORAL | Status: DC

## 2012-01-14 MED ORDER — ALPRAZOLAM 0.25 MG PO TABS: 0.2500 mg | ORAL_TABLET | Freq: Three times a day (TID) | ORAL | Status: DC | PRN

## 2012-01-14 NOTE — Telephone Encounter (Signed)
Pt called upset and crying about need for medication to help calm her nerves.  Pt relate she has not been sleeping.  Called in Xanax and Effexor to [facility] in [place].  Gave pt instructions on how to take medication.  Received verbal understanding.  Gave pt emotional support.

## 2012-01-14 NOTE — Progress Notes (Signed)
01-14-2012  Patient seen for initial psychological evaluation.  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  She declined a second appointment at this time and was encouraged to call to make one soon. Dr. Donnie Coffin was e-mailed with a recommendation for antidepressants and a few sleep meds to help manage her insomnia.  She also needs to see a DDS to address her periodontal disease. GAF 50

## 2012-01-14 NOTE — Progress Notes (Signed)
Mailed after appt letter to pt. 

## 2012-01-17 ENCOUNTER — Telehealth: Payer: Self-pay | Admitting: Genetic Counselor

## 2012-01-17 NOTE — Telephone Encounter (Signed)
Revealed negative BRCA and BART results.  Discussed panel testing for the future.

## 2012-01-21 ENCOUNTER — Encounter (INDEPENDENT_AMBULATORY_CARE_PROVIDER_SITE_OTHER): Payer: Self-pay | Admitting: Surgery

## 2012-01-21 ENCOUNTER — Encounter: Payer: Self-pay | Admitting: Genetic Counselor

## 2012-01-23 ENCOUNTER — Other Ambulatory Visit: Payer: Self-pay | Admitting: *Deleted

## 2012-01-23 DIAGNOSIS — C50919 Malignant neoplasm of unspecified site of unspecified female breast: Secondary | ICD-10-CM

## 2012-01-23 MED ORDER — ZOLPIDEM TARTRATE 5 MG PO TABS: 5.0000 mg | ORAL_TABLET | Freq: Every evening | ORAL | Status: DC | PRN

## 2012-01-23 NOTE — Telephone Encounter (Signed)
Pt called c/o difficulty sleeping.  Per Dr. Donnie Coffin called Ambien 5mg  to [facility] in [place] [phone].

## 2012-01-31 ENCOUNTER — Other Ambulatory Visit: Payer: Self-pay | Admitting: Emergency Medicine

## 2012-02-07 ENCOUNTER — Encounter: Payer: Self-pay | Admitting: *Deleted

## 2012-02-07 NOTE — Progress Notes (Signed)
Pt called stating she had 8 teeth removed on 02/06/12.  Pt relate that the infection that she had in her mouth is now gone.  Pt report some pain and swelling, but that it was being controlled by pain medicine and ice packs.

## 2012-02-10 ENCOUNTER — Other Ambulatory Visit: Payer: Self-pay | Admitting: Oncology

## 2012-02-10 DIAGNOSIS — C50419 Malignant neoplasm of upper-outer quadrant of unspecified female breast: Secondary | ICD-10-CM

## 2012-02-11 ENCOUNTER — Encounter (HOSPITAL_COMMUNITY): Payer: Self-pay | Admitting: Pharmacy Technician

## 2012-02-17 ENCOUNTER — Telehealth (INDEPENDENT_AMBULATORY_CARE_PROVIDER_SITE_OTHER): Payer: Self-pay

## 2012-02-17 ENCOUNTER — Telehealth: Payer: Self-pay | Admitting: *Deleted

## 2012-02-17 ENCOUNTER — Encounter (HOSPITAL_COMMUNITY)
Admission: RE | Admit: 2012-02-17 | Discharge: 2012-02-17 | Disposition: A | Discharge status: Home / Self Care | Payer: Medicaid Other | Source: Ambulatory Visit | Attending: Surgery | Admitting: Surgery

## 2012-02-17 ENCOUNTER — Encounter (HOSPITAL_COMMUNITY): Payer: Self-pay

## 2012-02-17 HISTORY — DX: Adverse effect of unspecified anesthetic, initial encounter: T41.45XA

## 2012-02-17 HISTORY — DX: Other complications of anesthesia, initial encounter: T88.59XA

## 2012-02-17 HISTORY — DX: Anemia, unspecified: D64.9

## 2012-02-17 HISTORY — DX: Gastro-esophageal reflux disease without esophagitis: K21.9

## 2012-02-17 LAB — CBC
HCT: 36.8 % (ref 36.0–46.0)
MCV: 89.5 fL (ref 78.0–100.0)
RBC: 4.11 MIL/uL (ref 3.87–5.11)
WBC: 8.6 10*3/uL (ref 4.0–10.5)

## 2012-02-17 LAB — BASIC METABOLIC PANEL
BUN: 7 mg/dL (ref 6–23)
CO2: 27 mEq/L (ref 19–32)
Chloride: 103 mEq/L (ref 96–112)
Creatinine, Ser: 0.58 mg/dL (ref 0.50–1.10)

## 2012-02-17 LAB — HCG, SERUM, QUALITATIVE: Preg, Serum: NEGATIVE

## 2012-02-17 LAB — SURGICAL PCR SCREEN: Staphylococcus aureus: NEGATIVE

## 2012-02-17 MED ORDER — CHLORHEXIDINE GLUCONATE 4 % EX LIQD: 1.0000 "application " | Freq: Once | CUTANEOUS | Status: DC

## 2012-02-17 NOTE — Telephone Encounter (Signed)
Devina at [facility] preadmit states our consent is wrong and states right breast cancer. Please correct the order when possible. Thanks.

## 2012-02-17 NOTE — Telephone Encounter (Signed)
To clarify - she presented with a RIGHT breast mass that turned out to be benign, but on work up found to have a left breast cancer. See mammogram report and MRI.

## 2012-02-17 NOTE — Telephone Encounter (Signed)
The pathology report states left breast cancer.

## 2012-02-17 NOTE — Telephone Encounter (Signed)
Deanna from [facility] pre cert called asking which breast patient has cancer in. On Dr. Tenna Child surgery consent the patient needs to sign it says left breast cancer but Dr. Maxcine Ham has right breast cancer down on his. Office notes were unclear. Dr. Jamey Ripa says under assessment left breast cancer but under HPI he says right breast lump. I told her Jamey Ripa is out of office until next week but I will send this message to him and he can clarify and make any necessary changes next week.

## 2012-02-17 NOTE — Telephone Encounter (Signed)
Pt called to inform that Dr. Odis Luster has cancelled reconstruction d/t Medicaid.  I spoke to Dr. Odis Luster office and was informed that Medicaid has not approved reconstruction.  Kerry Travis asked if she should wait to have surgery with reconstruction or should she go with surgery and have delayed reconstruction.  Informed pt to go forth with surgery and have delayed reconstruction.  Spoke to Dr. Tenna Child nurse to inform Kerry Travis is in question of surgery.  Pt request to be referred to another plastic surgeon.  Referral made to Dr. Kelly Splinter.  Left date and time and request for pt to confirm appt for 02/21/12 at 1:30.

## 2012-02-17 NOTE — Progress Notes (Signed)
Contacted Dr. Tenna Child office, spoke with Marcelino Duster to confirm laterality of breast cancer.  Marcelino Duster states doctors note states left breast ca.  Unable to have order for consent changed at this time due to Dr. Jamey Ripa being out on vacation. States she will notify Dr. Jamey Ripa of this upon his return, have corrected consent signed day of surgery.

## 2012-02-17 NOTE — Telephone Encounter (Signed)
Pt called to let Dr. Jamey Ripa know Dr. Odis Luster cancelled her surgery.  She has questions.  Please call her today if possible.

## 2012-02-17 NOTE — Telephone Encounter (Signed)
Spoke with patient and explained the delay with medicaid and Dr Maxcine Ham office. She will go ahead with surgery with Dr Jamey Ripa next week. She does want to go ahead and see Dr Kelly Splinter to discuss delayed reconstruction and I let her know I would have Dawn work on that. She will call me with any additional questions.

## 2012-02-17 NOTE — Pre-Procedure Instructions (Signed)
21 Kerry Travis  02/17/2012   Your procedure is scheduled on:  Tuesday February 25, 2012  Report to [facility] at 6:00 AM.  Call this number if you have problems the morning of surgery: [phone]   Remember:   Do not eat food or drinkAfter Midnight.      Take these medicines the morning of surgery with A SIP OF WATER: xanax, effexor,    Do not wear jewelry, make-up or nail polish.  Do not wear lotions, powders, or perfumes.  Do not shave 48 hours prior to surgery. Men may shave face and neck.  Do not bring valuables to the hospital.  Contacts, dentures or bridgework may not be worn into surgery.  Leave suitcase in the car. After surgery it may be brought to your room.  For patients admitted to the hospital, checkout time is 11:00 AM the day of discharge.   Patients discharged the day of surgery will not be allowed to drive home.  Name and phone number of your driver: family / friend  Special Instructions: CHG Shower Use Special Wash: 1/2 bottle night before surgery and 1/2 bottle morning of surgery.   Please read over the following fact sheets that you were given: Pain Booklet, Coughing and Deep Breathing, MRSA Information and Surgical Site Infection Prevention

## 2012-02-18 ENCOUNTER — Telehealth: Payer: Self-pay | Admitting: Oncology

## 2012-02-18 NOTE — Telephone Encounter (Signed)
Faxed pt medical records to Dr. Leonie Green office. Per Marianne Sofia

## 2012-02-19 ENCOUNTER — Other Ambulatory Visit (INDEPENDENT_AMBULATORY_CARE_PROVIDER_SITE_OTHER): Payer: Self-pay | Admitting: Surgery

## 2012-02-20 ENCOUNTER — Other Ambulatory Visit (INDEPENDENT_AMBULATORY_CARE_PROVIDER_SITE_OTHER): Payer: Self-pay | Admitting: Surgery

## 2012-02-20 DIAGNOSIS — C50912 Malignant neoplasm of unspecified site of left female breast: Secondary | ICD-10-CM

## 2012-02-24 MED ORDER — CHLORHEXIDINE GLUCONATE 4 % EX LIQD: 1.0000 "application " | Freq: Once | CUTANEOUS | Status: DC

## 2012-02-24 MED ORDER — CHLORHEXIDINE GLUCONATE 4 % EX LIQD
1.0000 | Freq: Once | CUTANEOUS | Status: DC
Start: 2012-02-25 — End: 2012-02-25

## 2012-02-24 MED ORDER — CEFAZOLIN SODIUM-DEXTROSE 2-3 GM-% IV SOLR
2.0000 g | Freq: Once | INTRAVENOUS | Status: AC
  Administered 2012-02-25: 2 g via INTRAVENOUS
  Filled 2012-02-24: qty 50

## 2012-02-24 NOTE — Progress Notes (Signed)
Patient called to arrive at 530 am

## 2012-02-25 ENCOUNTER — Ambulatory Visit (HOSPITAL_COMMUNITY)
Admission: RE | Admit: 2012-02-25 | Discharge: 2012-02-26 | Disposition: A | Discharge status: Home / Self Care | Payer: Medicaid Other | Source: Ambulatory Visit | Attending: Surgery | Admitting: Surgery

## 2012-02-25 ENCOUNTER — Ambulatory Visit (HOSPITAL_COMMUNITY)
Admission: RE | Admit: 2012-02-25 | Discharge: 2012-02-25 | Disposition: A | Discharge status: Home / Self Care | Payer: Medicaid Other | Source: Ambulatory Visit | Attending: Surgery | Admitting: Surgery

## 2012-02-25 ENCOUNTER — Encounter (HOSPITAL_COMMUNITY): Payer: Self-pay | Admitting: Anesthesiology

## 2012-02-25 ENCOUNTER — Encounter (HOSPITAL_COMMUNITY): Payer: Self-pay | Admitting: Surgery

## 2012-02-25 ENCOUNTER — Encounter (HOSPITAL_COMMUNITY)
Admission: RE | Disposition: A | Discharge status: Home / Self Care | Payer: Self-pay | Source: Ambulatory Visit | Attending: Surgery

## 2012-02-25 ENCOUNTER — Ambulatory Visit (HOSPITAL_COMMUNITY): Payer: Medicaid Other | Admitting: Anesthesiology

## 2012-02-25 ENCOUNTER — Other Ambulatory Visit (HOSPITAL_COMMUNITY): Payer: Self-pay

## 2012-02-25 DIAGNOSIS — C50419 Malignant neoplasm of upper-outer quadrant of unspecified female breast: Secondary | ICD-10-CM | POA: Insufficient documentation

## 2012-02-25 DIAGNOSIS — C50912 Malignant neoplasm of unspecified site of left female breast: Secondary | ICD-10-CM

## 2012-02-25 DIAGNOSIS — D0590 Unspecified type of carcinoma in situ of unspecified breast: Secondary | ICD-10-CM

## 2012-02-25 DIAGNOSIS — F17200 Nicotine dependence, unspecified, uncomplicated: Secondary | ICD-10-CM | POA: Insufficient documentation

## 2012-02-25 DIAGNOSIS — D249 Benign neoplasm of unspecified breast: Secondary | ICD-10-CM

## 2012-02-25 DIAGNOSIS — C50919 Malignant neoplasm of unspecified site of unspecified female breast: Secondary | ICD-10-CM

## 2012-02-25 HISTORY — DX: Other specified postprocedural states: Z98.890

## 2012-02-25 HISTORY — DX: Other specified postprocedural states: R11.2

## 2012-02-25 SURGERY — MASTECTOMY, SIMPLE
Anesthesia: General | Site: Breast | Laterality: Left | Wound class: Clean

## 2012-02-25 MED ORDER — LIDOCAINE HCL (CARDIAC) 20 MG/ML IV SOLN
INTRAVENOUS | Status: DC | PRN
  Administered 2012-02-25: 30 mg via INTRAVENOUS

## 2012-02-25 MED ORDER — PROPOFOL 10 MG/ML IV BOLUS
INTRAVENOUS | Status: DC | PRN
  Administered 2012-02-25: 130 mg via INTRAVENOUS

## 2012-02-25 MED ORDER — PROMETHAZINE HCL 25 MG/ML IJ SOLN
6.2500 mg | Freq: Four times a day (QID) | INTRAMUSCULAR | Status: DC | PRN
  Administered 2012-02-25 – 2012-02-26 (×3): 6.25 mg via INTRAVENOUS
  Filled 2012-02-25 (×3): qty 1

## 2012-02-25 MED ORDER — BUPIVACAINE HCL (PF) 0.25 % IJ SOLN
INTRAMUSCULAR | Status: AC
  Filled 2012-02-25: qty 30

## 2012-02-25 MED ORDER — FENTANYL CITRATE 0.05 MG/ML IJ SOLN
INTRAMUSCULAR | Status: DC | PRN
  Administered 2012-02-25 (×4): 50 ug via INTRAVENOUS
  Administered 2012-02-25: 25 ug via INTRAVENOUS
  Administered 2012-02-25: 100 ug via INTRAVENOUS
  Administered 2012-02-25: 25 ug via INTRAVENOUS
  Administered 2012-02-25: 50 ug via INTRAVENOUS
  Administered 2012-02-25: 25 ug via INTRAVENOUS
  Administered 2012-02-25: 50 ug via INTRAVENOUS
  Administered 2012-02-25: 25 ug via INTRAVENOUS

## 2012-02-25 MED ORDER — SODIUM CHLORIDE 0.9 % IJ SOLN
INTRAMUSCULAR | Status: DC | PRN
  Administered 2012-02-25: 08:00:00 via INTRAMUSCULAR

## 2012-02-25 MED ORDER — LACTATED RINGERS IV SOLN
INTRAVENOUS | Status: DC | PRN
  Administered 2012-02-25 (×2): via INTRAVENOUS

## 2012-02-25 MED ORDER — HYDROMORPHONE HCL PF 1 MG/ML IJ SOLN
INTRAMUSCULAR | Status: DC | PRN
  Administered 2012-02-25 (×2): 0.5 mg via INTRAVENOUS

## 2012-02-25 MED ORDER — ONDANSETRON HCL 4 MG/2ML IJ SOLN
4.0000 mg | Freq: Four times a day (QID) | INTRAMUSCULAR | Status: DC | PRN
  Administered 2012-02-25 (×2): 4 mg via INTRAVENOUS
  Filled 2012-02-25 (×2): qty 2

## 2012-02-25 MED ORDER — ROCURONIUM BROMIDE 100 MG/10ML IV SOLN
INTRAVENOUS | Status: DC | PRN
  Administered 2012-02-25: 35 mg via INTRAVENOUS

## 2012-02-25 MED ORDER — HYDROMORPHONE HCL PF 1 MG/ML IJ SOLN
2.0000 mg | INTRAMUSCULAR | Status: DC | PRN
  Administered 2012-02-25: 2 mg via INTRAVENOUS
  Administered 2012-02-25 – 2012-02-26 (×5): 1 mg via INTRAVENOUS
  Filled 2012-02-25 (×2): qty 1
  Filled 2012-02-25: qty 2
  Filled 2012-02-25 (×3): qty 1

## 2012-02-25 MED ORDER — ACETAMINOPHEN 10 MG/ML IV SOLN
1000.0000 mg | Freq: Once | INTRAVENOUS | Status: DC
  Administered 2012-02-25: 1000 mg via INTRAVENOUS

## 2012-02-25 MED ORDER — HEPARIN SODIUM (PORCINE) 5000 UNIT/ML IJ SOLN
5000.0000 [IU] | Freq: Three times a day (TID) | INTRAMUSCULAR | Status: DC
Start: 2012-02-26 — End: 2012-02-26
  Administered 2012-02-26 (×2): 5000 [IU] via SUBCUTANEOUS
  Filled 2012-02-25 (×4): qty 1

## 2012-02-25 MED ORDER — ACETAMINOPHEN 10 MG/ML IV SOLN
INTRAVENOUS | Status: AC
  Filled 2012-02-25: qty 100

## 2012-02-25 MED ORDER — TECHNETIUM TC 99M SULFUR COLLOID FILTERED
1.0000 | Freq: Once | INTRAVENOUS | Status: AC | PRN
  Administered 2012-02-25: 1 via INTRADERMAL

## 2012-02-25 MED ORDER — ALPRAZOLAM 0.25 MG PO TABS
0.2500 mg | ORAL_TABLET | Freq: Three times a day (TID) | ORAL | Status: DC
  Administered 2012-02-25 (×2): 0.25 mg via ORAL
  Filled 2012-02-25 (×2): qty 1

## 2012-02-25 MED ORDER — SODIUM CHLORIDE 0.9 % IR SOLN
Status: DC | PRN
  Administered 2012-02-25: 09:00:00

## 2012-02-25 MED ORDER — PHENYLEPHRINE HCL 10 MG/ML IJ SOLN
INTRAMUSCULAR | Status: DC | PRN
  Administered 2012-02-25: 80 ug via INTRAVENOUS

## 2012-02-25 MED ORDER — CEFAZOLIN SODIUM-DEXTROSE 2-3 GM-% IV SOLR: 2.0000 g | INTRAVENOUS | Status: DC

## 2012-02-25 MED ORDER — WHITE PETROLATUM GEL
Status: AC
  Administered 2012-02-25: 17:00:00
  Filled 2012-02-25: qty 5

## 2012-02-25 MED ORDER — VENLAFAXINE HCL 37.5 MG PO TABS
37.5000 mg | ORAL_TABLET | Freq: Two times a day (BID) | ORAL | Status: DC
  Administered 2012-02-25 – 2012-02-26 (×2): 37.5 mg via ORAL
  Filled 2012-02-25 (×4): qty 1

## 2012-02-25 MED ORDER — EPHEDRINE SULFATE 50 MG/ML IJ SOLN
INTRAMUSCULAR | Status: DC | PRN
  Administered 2012-02-25 (×4): 10 mg via INTRAVENOUS

## 2012-02-25 MED ORDER — 0.9 % SODIUM CHLORIDE (POUR BTL) OPTIME
TOPICAL | Status: DC | PRN
  Administered 2012-02-25: 1000 mL

## 2012-02-25 MED ORDER — NEOSTIGMINE METHYLSULFATE 1 MG/ML IJ SOLN
INTRAMUSCULAR | Status: DC | PRN
  Administered 2012-02-25: 1 mg via INTRAVENOUS

## 2012-02-25 MED ORDER — LIDOCAINE HCL 4 % MT SOLN
OROMUCOSAL | Status: DC | PRN
  Administered 2012-02-25: 4 mL via TOPICAL

## 2012-02-25 MED ORDER — HYDROMORPHONE HCL 2 MG PO TABS
2.0000 mg | ORAL_TABLET | ORAL | Status: DC | PRN
  Administered 2012-02-26 (×3): 2 mg via ORAL
  Filled 2012-02-25 (×3): qty 1

## 2012-02-25 MED ORDER — HYDROMORPHONE HCL PF 1 MG/ML IJ SOLN
INTRAMUSCULAR | Status: AC
  Administered 2012-02-25: 1 mg via INTRAVENOUS
  Filled 2012-02-25: qty 1

## 2012-02-25 MED ORDER — METHYLENE BLUE 1 % INJ SOLN
INTRAMUSCULAR | Status: AC
  Filled 2012-02-25: qty 10

## 2012-02-25 MED ORDER — ONDANSETRON HCL 4 MG/2ML IJ SOLN
INTRAMUSCULAR | Status: DC | PRN
  Administered 2012-02-25 (×2): 4 mg via INTRAVENOUS

## 2012-02-25 MED ORDER — HYDROMORPHONE HCL PF 1 MG/ML IJ SOLN
0.2500 mg | INTRAMUSCULAR | Status: DC | PRN
  Administered 2012-02-25 (×4): 0.5 mg via INTRAVENOUS

## 2012-02-25 MED ORDER — KCL-LACTATED RINGERS-D5W 20 MEQ/L IV SOLN
INTRAVENOUS | Status: DC
  Administered 2012-02-25 – 2012-02-26 (×2): via INTRAVENOUS
  Filled 2012-02-25 (×4): qty 1000

## 2012-02-25 MED ORDER — ONDANSETRON HCL 4 MG PO TABS: 4.0000 mg | ORAL_TABLET | Freq: Four times a day (QID) | ORAL | Status: DC | PRN

## 2012-02-25 MED ORDER — ZOLPIDEM TARTRATE 5 MG PO TABS
5.0000 mg | ORAL_TABLET | Freq: Every evening | ORAL | Status: DC | PRN
  Administered 2012-02-25: 5 mg via ORAL
  Filled 2012-02-25: qty 1

## 2012-02-25 MED ORDER — ONDANSETRON HCL 4 MG/2ML IJ SOLN
INTRAMUSCULAR | Status: AC
  Filled 2012-02-25: qty 2

## 2012-02-25 MED ORDER — MIDAZOLAM HCL 5 MG/5ML IJ SOLN
INTRAMUSCULAR | Status: DC | PRN
  Administered 2012-02-25: 2 mg via INTRAVENOUS

## 2012-02-25 MED ORDER — GLYCOPYRROLATE 0.2 MG/ML IJ SOLN
INTRAMUSCULAR | Status: DC | PRN
  Administered 2012-02-25: 0.2 mg via INTRAVENOUS

## 2012-02-25 MED ORDER — ONDANSETRON HCL 4 MG/2ML IJ SOLN
4.0000 mg | Freq: Once | INTRAMUSCULAR | Status: AC | PRN
  Administered 2012-02-25: 4 mg via INTRAVENOUS

## 2012-02-25 MED ORDER — CEFAZOLIN SODIUM 1-5 GM-% IV SOLN
1.0000 g | Freq: Four times a day (QID) | INTRAVENOUS | Status: AC
  Administered 2012-02-25 – 2012-02-26 (×3): 1 g via INTRAVENOUS
  Filled 2012-02-25 (×3): qty 50

## 2012-02-25 MED ORDER — DEXAMETHASONE SODIUM PHOSPHATE 4 MG/ML IJ SOLN
INTRAMUSCULAR | Status: DC | PRN
  Administered 2012-02-25: 4 mg via INTRAVENOUS

## 2012-02-25 SURGICAL SUPPLY — 53 items
APPLIER CLIP 9.375 SM OPEN (CLIP) ×3
BINDER BREAST MEDIUM (GAUZE/BANDAGES/DRESSINGS) ×3 IMPLANT
BIOPATCH RED 1 DISK 7.0 (GAUZE/BANDAGES/DRESSINGS) ×6 IMPLANT
BLADE SURG 10 STRL SS (BLADE) ×3 IMPLANT
CANISTER SUCTION 2500CC (MISCELLANEOUS) ×3 IMPLANT
CHLORAPREP W/TINT 26ML (MISCELLANEOUS) ×6 IMPLANT
CLIP APPLIE 9.375 SM OPEN (CLIP) ×2 IMPLANT
CLOTH BEACON ORANGE TIMEOUT ST (SAFETY) ×3 IMPLANT
CONT SPEC 4OZ CLIKSEAL STRL BL (MISCELLANEOUS) ×3 IMPLANT
COVER PROBE W GEL 5X96 (DRAPES) ×3 IMPLANT
COVER SURGICAL LIGHT HANDLE (MISCELLANEOUS) ×3 IMPLANT
DECANTER SPIKE VIAL GLASS SM (MISCELLANEOUS) ×3 IMPLANT
DRAIN CHANNEL 19F RND (DRAIN) ×6 IMPLANT
DRAPE LAPAROSCOPIC ABDOMINAL (DRAPES) ×3 IMPLANT
DRAPE ORTHO SPLIT 77X108 STRL (DRAPES) ×2
DRAPE PROXIMA HALF (DRAPES) ×9 IMPLANT
DRAPE SURG ORHT 6 SPLT 77X108 (DRAPES) ×4 IMPLANT
DRAPE UTILITY 15X26 W/TAPE STR (DRAPE) ×6 IMPLANT
DRSG ADAPTIC 3X8 NADH LF (GAUZE/BANDAGES/DRESSINGS) ×6 IMPLANT
DRSG PAD ABDOMINAL 8X10 ST (GAUZE/BANDAGES/DRESSINGS) ×6 IMPLANT
ELECT BLADE 4.0 EZ CLEAN MEGAD (MISCELLANEOUS) ×3
ELECT CAUTERY BLADE 6.4 (BLADE) ×3 IMPLANT
ELECT REM PT RETURN 9FT ADLT (ELECTROSURGICAL) ×3
ELECTRODE BLDE 4.0 EZ CLN MEGD (MISCELLANEOUS) ×2 IMPLANT
ELECTRODE REM PT RTRN 9FT ADLT (ELECTROSURGICAL) ×2 IMPLANT
EVACUATOR SILICONE 100CC (DRAIN) ×3 IMPLANT
GAUZE VASELINE 3X9 (GAUZE/BANDAGES/DRESSINGS) ×3 IMPLANT
GLOVE BIO SURGEON STRL SZ7 (GLOVE) ×3 IMPLANT
GLOVE BIOGEL PI IND STRL 7.0 (GLOVE) ×4 IMPLANT
GLOVE BIOGEL PI INDICATOR 7.0 (GLOVE) ×2
GLOVE ECLIPSE 6.5 STRL STRAW (GLOVE) ×3 IMPLANT
GLOVE EUDERMIC 7 POWDERFREE (GLOVE) ×9 IMPLANT
GLOVE SS BIOGEL STRL SZ 6.5 (GLOVE) ×2 IMPLANT
GLOVE SUPERSENSE BIOGEL SZ 6.5 (GLOVE) ×1
GOWN PREVENTION PLUS XLARGE (GOWN DISPOSABLE) ×3 IMPLANT
GOWN STRL NON-REIN LRG LVL3 (GOWN DISPOSABLE) ×12 IMPLANT
KIT BASIN OR (CUSTOM PROCEDURE TRAY) ×3 IMPLANT
KIT ROOM TURNOVER OR (KITS) ×3 IMPLANT
NEEDLE HYPO 25GX1X1/2 BEV (NEEDLE) ×3 IMPLANT
NS IRRIG 1000ML POUR BTL (IV SOLUTION) ×3 IMPLANT
PACK GENERAL/GYN (CUSTOM PROCEDURE TRAY) ×3 IMPLANT
PAD ARMBOARD 7.5X6 YLW CONV (MISCELLANEOUS) ×3 IMPLANT
SPECIMEN JAR LARGE (MISCELLANEOUS) ×3 IMPLANT
SPONGE GAUZE 4X4 12PLY (GAUZE/BANDAGES/DRESSINGS) ×3 IMPLANT
SPONGE INTESTINAL PEANUT (DISPOSABLE) ×3 IMPLANT
SPONGE LAP 4X18 X RAY DECT (DISPOSABLE) ×3 IMPLANT
STAPLER VISISTAT 35W (STAPLE) ×3 IMPLANT
SUT ETHILON 3 0 FSL (SUTURE) ×3 IMPLANT
SUT MNCRL AB 3-0 PS2 18 (SUTURE) ×24 IMPLANT
SUT VIC AB 3-0 SH 18 (SUTURE) ×6 IMPLANT
SYR CONTROL 10ML LL (SYRINGE) ×3 IMPLANT
TOWEL OR 17X24 6PK STRL BLUE (TOWEL DISPOSABLE) ×3 IMPLANT
TOWEL OR 17X26 10 PK STRL BLUE (TOWEL DISPOSABLE) ×3 IMPLANT

## 2012-02-25 NOTE — Preoperative (Signed)
Beta Blockers   Reason not to administer Beta Blockers:Not Applicable 

## 2012-02-25 NOTE — Transfer of Care (Signed)
Immediate Anesthesia Transfer of Care Note  Patient: Kerry Travis  Procedure(s) Performed: Procedure(s) (LRB) with comments: TOTAL MASTECTOMY (Bilateral) - Bilateral mastectomies & Left SLNbx  AXILLARY SENTINEL NODE BIOPSY (Left)  Patient Location: PACU  Anesthesia Type: General  Level of Consciousness: awake, alert  and oriented  Airway & Oxygen Therapy: Patient Spontanous Breathing and Patient connected to face mask oxygen  Post-op Assessment: Report given to PACU RN  Post vital signs: Reviewed and stable  Complications: No apparent anesthesia complications

## 2012-02-25 NOTE — Op Note (Signed)
Kerry Travis 04-13-1971 [id number] 01/09/2012  Preoperative diagnosis: clinical stage I; strong family history of breast cancer  Postoperative diagnosis: same  Procedure: bilateral total mastectomy with left-sided blue dye injection and left axillary sentinel lymph node dissection  Surgeon: Currie Paris, MD, FACS   Anesthesia: General   Clinical History and Indications: this patient has presented with a left breast cancer and a strong family history. Genetic testing was negative. After length discussion with the patient she would have a total mastectomy on the left and a prophylactic mastectomy on the right. We planned a sentinel lymph node excision on the left side.  Initially, an immediate reconstruction had been contemplated but has been deferred.  Description of Procedure: the patient is seen in the preoperative area in the left breast marked as the site for the lymph node. She was injected by the radioisotope technician. She is taken to the operating room. After satisfactory general endotracheal anesthesia had been obtained the timeout was done. 5 cc of dilute methylene blue was injected subareolar on the left side and massaged in. A full prep and drape and a second timeout was then done.  I outlined elliptical incisions bilaterally. I marked an area in the left axilla using the neoprobe as a hot area. I initially made a right elliptical incision and elevating skin flaps with the use of cautery going to the clavicles sternum and inframammary fold and this was. The breast was removed from medial to lateral. After several minutes making sure everything was dry. I put a 19 Blake drain and secured it with a 2-0 nylon. I irrigated again and everything appeared dry. A moist lap pad and soaked with antibiotic solution and turned my attention to the left side.  A similar incision was made here. I was able to trace a blue lymphatic into the axilla as like a skin flap raised that way and found a  small firm lymph node in the lateral aspect of the breast which was removed. A hot sentinel node was then found was bright blue. It had counts of 2200. They're essentially no counts in the axilla once that was removed.the skin flaps were completed as the right side of the breast removed from medial to lateral. I did not reenter the axillary area.  While waiting for the touch study, the sentinel lymph nodes I turned my attention back to the right. It remained completely dry was working on the left side silicosis with interrupted 3-0 Monocryl sutures followed by sterile dressings at the end of the case. I went back and check the left side and closed in a similar fashion. Again it remained dry while we were closing the right side. A 19 Blake drain here as well. Pathology reported that her lymph node was negative.  She'll dressings were applied the patient tolerated the procedure well. There no operative complications. Counts were correct.   Currie Paris, MD, FACS 02/25/2012 10:50 AM  .

## 2012-02-25 NOTE — H&P (Signed)
HPI  Kerry Travis is a 42 y.o. female. This patient felt a lump and was having some pain in her right breast. She was evaluated with mammograms and ultrasound. The area in the right breast turned out to be fibrocystic change with cysts. However a spiculated mass in the left breast a biopsy has shown receptor positive invasive ductal carcinoma. MRI has shown some other lesions in both breasts appear to be benign fibroadenomas. She presents today for surgery She elected a left TM with SLN and a right TM prophylactic. SAhe planned reconstructions later, although had considered them to be done now.  HPI  Past Medical History   Diagnosis  Date   .  Breast cancer    .  Seizures    .  Anxiety    .  Depression    .  Headache    .  Nipple discharge     Past Surgical History   Procedure  Date   .  Cesarean section      1989, 1990, 1992   .  Tubal ligation  1993   No family history on file.  Social History  History   Substance Use Topics   .  Smoking status:  Current Everyday Smoker -- 1.0 packs/day   .  Smokeless tobacco:  Not on file   .  Alcohol Use:  No   No Known Allergies  Current Outpatient Prescriptions   Medication  Sig  Dispense  Refill   .  clonazePAM (KLONOPIN) 0.5 MG tablet  Take 0.5 mg by mouth 2 (two) times daily as needed.     .  dimenhyDRINATE (DRAMAMINE) 50 MG tablet  Take 50 mg by mouth as needed.     .  diphenhydrAMINE (SOMINEX) 25 MG tablet  Take 25 mg by mouth at bedtime as needed.     Marland Kitchen  ibuprofen (ADVIL,MOTRIN) 100 MG tablet  Take 100 mg by mouth every 6 (six) hours as needed.     Review of Systems  Review of Systems  Constitutional: Positive for chills. Negative for fever and unexpected weight change.  HENT: Positive for congestion. Negative for hearing loss, sore throat, trouble swallowing and voice change.  Eyes: Negative for visual disturbance.  Respiratory: Negative for cough and wheezing.  Cardiovascular: Negative for chest pain, palpitations and leg  swelling.  Gastrointestinal: Negative for nausea, vomiting, abdominal pain, diarrhea, constipation, blood in stool, abdominal distention and anal bleeding.  Genitourinary: Negative for hematuria, vaginal bleeding and difficulty urinating.  Musculoskeletal: Negative for arthralgias.  Skin: Negative for rash and wound.  Neurological: Positive for seizures and headaches. Negative for syncope.  Hematological: Negative for adenopathy. Does not bruise/bleed easily.  Psychiatric/Behavioral: Negative for confusion.  Has had issues with anxiety and depression as well as phobias  Blood pressure 97/63, pulse 86, temperature 98.6 F (37 C), resp. rate 20, height 5\' 2"  (1.575 m), weight 115 lb (52.164 kg), last menstrual period 12/18/2011.  Physical Exam  Physical Exam  VS: Blood pressure 101/64, pulse 79, temperature 97.6 F (36.4 C), temperature source Oral, resp. rate 20, last menstrual period 02/17/2012, SpO2 100.00%.  Constitutional: She is oriented to person, place, and time. She appears well-developed and well-nourished. No distress.  HENT:  Head: Normocephalic and atraumatic.  Mouth/Throat: Oropharynx is clear and moist.  Eyes: Conjunctivae and EOM are normal. Pupils are equal, round, and reactive to light. No scleral icterus.  Neck: Normal range of motion. Neck supple. No tracheal deviation present. No thyromegaly present.  Cardiovascular: Normal  rate, regular rhythm, normal heart sounds and intact distal pulses. Exam reveals no gallop and no friction rub.  No murmur heard.  Pulmonary/Chest: Effort normal and breath sounds normal. No respiratory distress. She has no wheezes. She has no rales.  The breasts are bilaterally dense, irregular, and tender. There is an ecchymosis in the upper outer quadrant of the left breast with what I think is some hematoma, not cancer. There no skin nipple or areolar changes noted.  Abdominal: Soft. Bowel sounds are normal. She exhibits no distension and no mass.  There is no tenderness. There is no rebound and no guarding.  Musculoskeletal: Normal range of motion. She exhibits no edema and no tenderness.  Lymphadenopathy:  She has no cervical adenopathy.  She has no axillary adenopathy.  Neurological: She is alert and oriented to person, place, and time.  Skin: Skin is warm and dry. No rash noted. She is not diaphoretic. No erythema.  Psychiatric: She has a normal mood and affect. Her behavior is normal. Judgment and thought content normal.  Data Reviewed  I have reviewed the mammogram and MRI films and reports with her radiologist and the pathology slides and reports for the pathologist. I reviewed the case with the medical and radiation oncologist as well  Assessment  Left breast cancer, upper outer quadrant, receptor positive  Strong family history of breast cancer  Fibrocystic breast disease  Plan Left TM and SLN, right TM I have reviewed the plans with the patient and marked the left breast as the side for the SLN,

## 2012-02-25 NOTE — Anesthesia Postprocedure Evaluation (Signed)
  Anesthesia Post-op Note  Patient: Kerry Travis  Procedure(s) Performed: Procedure(s) (LRB) with comments: TOTAL MASTECTOMY (Bilateral) - Bilateral mastectomies & Left SLNbx  AXILLARY SENTINEL NODE BIOPSY (Left)  Patient Location: PACU  Anesthesia Type: General  Level of Consciousness: awake, alert  and oriented  Airway and Oxygen Therapy: Patient Spontanous Breathing  Post-op Pain: mild, moderate  Post-op Assessment: Post-op Vital signs reviewed and Patient's Cardiovascular Status Stable  Post-op Vital Signs: stable  Complications: No apparent anesthesia complications

## 2012-02-25 NOTE — Anesthesia Preprocedure Evaluation (Addendum)
Anesthesia Evaluation  Patient identified by MRN, date of birth, ID band Patient awake    Reviewed: Allergy & Precautions, H&P , NPO status , Patient's Chart, lab work & pertinent test results  History of Anesthesia Complications (+) PONV  Airway Mallampati: II TM Distance: >3 FB Neck ROM: Full    Dental  (+) Teeth Intact and Poor Dentition   Pulmonary Current Smoker,  breath sounds clear to auscultation  Pulmonary exam normal       Cardiovascular Rhythm:Regular Rate:Normal     Neuro/Psych Seizures -, Well Controlled,  PSYCHIATRIC DISORDERS Anxiety Depression    GI/Hepatic GERD-  Controlled,  Endo/Other    Renal/GU      Musculoskeletal   Abdominal Normal abdominal exam  (+)   Peds  Hematology   Anesthesia Other Findings   Reproductive/Obstetrics                          Anesthesia Physical Anesthesia Plan  ASA: II  Anesthesia Plan: General   Post-op Pain Management:    Induction: Intravenous  Airway Management Planned: Oral ETT  Additional Equipment:   Intra-op Plan:   Post-operative Plan: Extubation in OR  Informed Consent: I have reviewed the patients History and Physical, chart, labs and discussed the procedure including the risks, benefits and alternatives for the proposed anesthesia with the patient or authorized representative who has indicated his/her understanding and acceptance.   Dental advisory given  Plan Discussed with: CRNA, Anesthesiologist and Surgeon  Anesthesia Plan Comments: (L. Breast Ca H/o post-op N/V Smoker Anxiety  Plan GA)       Anesthesia Quick Evaluation

## 2012-02-25 NOTE — Progress Notes (Signed)
Patient unable to void, bladder scanned for 719 ml of fluid.  MD notified, received order for I/O cath.  I/O cathed 1,000 ml of clear green urine.  Will continue to monitor.  Cardelia Sassano Terral

## 2012-02-26 LAB — CBC
MCHC: 33.8 g/dL (ref 30.0–36.0)
Platelets: 270 10*3/uL (ref 150–400)
RDW: 15.2 % (ref 11.5–15.5)
WBC: 9.4 10*3/uL (ref 4.0–10.5)

## 2012-02-26 LAB — BASIC METABOLIC PANEL
BUN: 4 mg/dL — ABNORMAL LOW (ref 6–23)
Chloride: 105 mEq/L (ref 96–112)
Creatinine, Ser: 0.5 mg/dL (ref 0.50–1.10)
GFR calc Af Amer: >90 mL/min (ref >90)
GFR calc non Af Amer: >90 mL/min (ref >90)
Potassium: 3.3 mEq/L — ABNORMAL LOW (ref 3.5–5.1)

## 2012-02-26 MED ORDER — DIPHENHYDRAMINE HCL 50 MG/ML IJ SOLN
12.5000 mg | Freq: Four times a day (QID) | INTRAMUSCULAR | Status: DC | PRN
  Administered 2012-02-26: 12.5 mg via INTRAVENOUS
  Filled 2012-02-26: qty 1

## 2012-02-26 MED ORDER — ZOLPIDEM TARTRATE 5 MG PO TABS: 10.0000 mg | ORAL_TABLET | Freq: Every evening | ORAL | Status: DC | PRN

## 2012-02-26 MED ORDER — DIPHENHYDRAMINE HCL 50 MG/ML IJ SOLN
12.5000 mg | Freq: Once | INTRAMUSCULAR | Status: AC
  Administered 2012-02-26: 12.5 mg via INTRAVENOUS
  Filled 2012-02-26: qty 1

## 2012-02-26 MED ORDER — PROMETHAZINE HCL 25 MG PO TABS: 25.0000 mg | ORAL_TABLET | Freq: Four times a day (QID) | ORAL | Status: DC | PRN

## 2012-02-26 MED ORDER — HYDROMORPHONE HCL 2 MG PO TABS: 2.0000 mg | ORAL_TABLET | ORAL | Status: DC | PRN

## 2012-02-26 MED ORDER — ALPRAZOLAM 0.5 MG PO TABS
0.5000 mg | ORAL_TABLET | Freq: Three times a day (TID) | ORAL | Status: DC
  Administered 2012-02-26 (×2): 0.5 mg via ORAL
  Filled 2012-02-26 (×2): qty 1

## 2012-02-26 MED ORDER — ALPRAZOLAM 0.5 MG PO TABS: 0.5000 mg | ORAL_TABLET | Freq: Three times a day (TID) | ORAL | Status: DC

## 2012-02-26 NOTE — Progress Notes (Signed)
0410 Patient unable to void up to bathroom several times unable to void. Patient very anxious. In and out catheter inserted obtained 800 ml Light green urine. Will continue to monitor.

## 2012-02-26 NOTE — Progress Notes (Signed)
1 Day Post-Op  Subjective: Pain controlled - having problems voiding and had a lot of nausea during the night. Couldn't sleep despite Remus Loffler and says that phenergan works better for nausea than Zofran. Wants to go home.  Objective: Vital signs in last 24 hours: Temp:  [97.3 F (36.3 C)-98.4 F (36.9 C)] 97.3 F (36.3 C) (09/25 1610) Pulse Rate:  [76-106] 79  (09/25 0613) Resp:  [11-18] 18  (09/25 0613) BP: (105-125)/(57-90) 107/73 mmHg (09/25 0613) SpO2:  [96 %-100 %] 100 % (09/25 0613) Weight:  [122 lb 11.2 oz (55.656 kg)] 122 lb 11.2 oz (55.656 kg) (09/24 1416)   Intake/Output from previous day: 09/24 0701 - 09/25 0700 In: 1882.5 [P.O.:120; I.V.:1762.5] Out: 3871 [Urine:3601; Drains:120; Blood:150] Intake/Output this shift:     General appearance: alert, cooperative, no distress and anxious and nervous Resp: clear to auscultation bilaterally  Incision: drssings dry, JP's thin  Lab Results:   Basename 02/26/12 0500  WBC 9.4  HGB 9.9*  HCT 29.3*  PLT 270   BMET  Basename 02/26/12 0500  NA 141  K 3.3*  CL 105  CO2 26  GLUCOSE 134*  BUN 4*  CREATININE 0.50  CALCIUM 9.0   PT/INR No results found for this basename: LABPROT:2,INR:2 in the last 72 hours ABG No results found for this basename: PHART:2,PCO2:2,PO2:2,HCO3:2 in the last 72 hours  MEDS, Scheduled    . acetaminophen      . ALPRAZolam  0.25 mg Oral TID  .  ceFAZolin (ANCEF) IV  1 g Intravenous Q6H  . diphenhydrAMINE  12.5 mg Intravenous Once  . heparin  5,000 Units Subcutaneous Q8H  . ondansetron      . venlafaxine  37.5 mg Oral BID  . white petrolatum      . DISCONTD: acetaminophen  1,000 mg Intravenous Once  . DISCONTD: chlorhexidine  1 application Topical Once    Studies/Results: Nm Sentinel Node Inj-no Rpt (breast)  02/25/2012  CLINICAL DATA: left breast cancer   Sulfur colloid was injected intradermally by the nuclear medicine  technologist for breast cancer sentinel node localization.       Assessment: s/p Procedure(s): TOTAL MASTECTOMY AXILLARY SENTINEL NODE BIOPSY Patient Active Problem List  Diagnosis  . Breast cancer, Left, UOQ,     Stanle post op Urinary retention Post-op nausea Anxiety  Plan: Patient doesn't want foley so will continue I&O cath for a while.Will adjust xanx up a bit to see if will help with anxiety. Will d/c zofran and try phenergan. Could d/c when nausea controlled and can void OK   LOS: 1 day     Currie Paris, MD, [facility], [place] [phone]   02/26/2012 8:11 AM

## 2012-02-26 NOTE — Discharge Summary (Addendum)
Patient ID: Kerry Travis [id number] 40 y.o. 1971-05-07  02/25/2012  Discharge date and time: 02/26/2012    Admitting Physician: Currie Paris  Discharge Physician: Currie Paris  Admission Diagnoses: Left invasive ductal carcinoma  Discharge Diagnoses: Left breast cancer, Stage II  Operations: Procedure(s): TOTAL MASTECTOMY AXILLARY SENTINEL NODE BIOPSY    Discharged Condition: good    Hospital Course: Patient kept in overnight following bilateral mastectomy. Did well, but initially had problem voiding, which had improved by the afternoon of the first day and the patient wished to go home  Consults: None  Significant Diagnostic Studies: Path reoport 1. Lymph node, sentinel, biopsy, Left - ONE LYMPH NODE, NEGATIVE FOR TUMOR (0/1). 2. Lymph node, sentinel, biopsy, Left - ONE LYMPH NODE, NEGATIVE FOR TUMOR (0/1). 3. Breast, simple mastectomy, Right - BENIGN BREAST, SEE COMMENT. - NO ATYPIA OR MALIGNANCY PRESENT. - SURGICAL MARGINS, NEGATIVE FOR ATYPIA OR MALIGNANCY. 4. Breast, simple mastectomy, Left total - INVASIVE DUCTAL CARCINOMA, GRADE I (2.1 CM). - INVASIVE TUMOR IS 1.3 CM FROM NEAREST MARGIN (DEEP). - DUCTAL CARCINOMA IN SITU, GRADE I. - SEE TUMOR SYNOPTIC TEMPLATE.  Treatments: surgery: AS  Noted abovoe  Disposition: Home

## 2012-02-26 NOTE — Progress Notes (Signed)
Discharge instructions gone over with patient and spouse. Home medications gone over. Prescriptions have been faxed to patient's pharmacy, except for pain medicine. That prescription is to be picked up at [facility]. Arm precautions, diet, and activity gone over with patient. Incisional and drain care gone over. Both patient and spouse demonstrate knowledge of how to empty the jps. Signs and symptoms of infection and reasons to call the doctor gone over with the patient. Patient was medicated for the ride home, and is to make follow up appointment with surgeon.  Also will call Dr. Jamey Ripa in the morning because pharmacy did not receive fax for xanax. Patient received text message from pharmacy concerning this matter and office was closed. She was voiding fine but has anxiety about her disease.

## 2012-02-26 NOTE — Progress Notes (Signed)
Nutrition Brief Note  Patient identified on the Malnutrition Screening Tool (MST) report for Unintentional weight loss, generating a score of 2.   Body mass index is 23.18 kg/(m^2). Pt meets criteria for normal weight based on current BMI.  Wt Readings from Last 10 Encounters:  02/25/12 122 lb 11.2 oz (55.656 kg)  02/25/12 122 lb 11.2 oz (55.656 kg)  02/17/12 116 lb 13.5 oz (53 kg)  01/08/12 115 lb (52.164 kg)  01/08/12 115 lb 1.6 oz (52.209 kg)   Current diet order is Regular. Labs and medications reviewed.   No nutrition interventions warranted at this time. If nutrition issues arise, please consult RD.   Leonette Most RD, LDN

## 2012-02-27 ENCOUNTER — Other Ambulatory Visit: Payer: Self-pay | Admitting: *Deleted

## 2012-02-27 ENCOUNTER — Telehealth (INDEPENDENT_AMBULATORY_CARE_PROVIDER_SITE_OTHER): Payer: Self-pay | Admitting: General Surgery

## 2012-02-27 ENCOUNTER — Encounter (INDEPENDENT_AMBULATORY_CARE_PROVIDER_SITE_OTHER): Payer: Self-pay | Admitting: Surgery

## 2012-02-27 DIAGNOSIS — C50919 Malignant neoplasm of unspecified site of unspecified female breast: Secondary | ICD-10-CM

## 2012-02-27 MED ORDER — ZOLPIDEM TARTRATE 10 MG PO TABS: 10.0000 mg | ORAL_TABLET | Freq: Every evening | ORAL | Status: DC | PRN

## 2012-02-27 NOTE — Telephone Encounter (Signed)
Spoke with patient and made her aware of pathology results. Appt made next week for follow up with Dr Jamey Ripa. Patient also inquiring about medications. Aware Britta Mccreedy spoke with Dr Jamey Ripa and got approval for xanax .5mg  and this was called to [facility] in [place], [place]. Patient will contact Dr Renelda Loma office about a refill on her Remus Loffler since this was originally prescribed by Dr Donnie Coffin. She already has pain medicine and medicine for nausea. She will call with any questions prior to appt.

## 2012-02-27 NOTE — Telephone Encounter (Signed)
Message copied by Liliana Cline on Thu Feb 27, 2012  8:31 AM ------      Message from: Currie Paris      Created: Thu Feb 27, 2012  8:23 AM       Tell the patient that her margins are OK and her lymph nodes are negative. I will discuss in detail in the office.Right side had no cancer and only the known cancer on the left

## 2012-02-28 ENCOUNTER — Other Ambulatory Visit (INDEPENDENT_AMBULATORY_CARE_PROVIDER_SITE_OTHER): Payer: Self-pay | Admitting: Surgery

## 2012-02-28 ENCOUNTER — Telehealth (INDEPENDENT_AMBULATORY_CARE_PROVIDER_SITE_OTHER): Payer: Self-pay

## 2012-02-28 MED ORDER — PROMETHAZINE HCL 25 MG PO TABS: 25.0000 mg | ORAL_TABLET | Freq: Four times a day (QID) | ORAL | Status: DC | PRN

## 2012-02-28 MED ORDER — HYDROMORPHONE HCL 2 MG PO TABS: 2.0000 mg | ORAL_TABLET | ORAL | Status: DC | PRN

## 2012-02-28 NOTE — Telephone Encounter (Signed)
Patient aware phenergan will be sent to her pharmacy and RX for dilaudid will be written and at the front for pick up. They may have their friend pick up RX because husband does not want to leave patient to drive from [place]. I made them aware this is okay.

## 2012-02-28 NOTE — Telephone Encounter (Signed)
Pt would like a refill on her Phenergan so she does not run out over the weekend.  She has #5 Dilaudid left.  Pls let her know if both can be refilled today.

## 2012-03-02 ENCOUNTER — Telehealth (INDEPENDENT_AMBULATORY_CARE_PROVIDER_SITE_OTHER): Payer: Self-pay

## 2012-03-02 ENCOUNTER — Encounter: Payer: Self-pay | Admitting: *Deleted

## 2012-03-02 ENCOUNTER — Other Ambulatory Visit: Payer: Self-pay | Admitting: *Deleted

## 2012-03-02 DIAGNOSIS — R110 Nausea: Secondary | ICD-10-CM

## 2012-03-02 DIAGNOSIS — G4700 Insomnia, unspecified: Secondary | ICD-10-CM

## 2012-03-02 MED ORDER — ALPRAZOLAM 0.5 MG PO TABS: 0.5000 mg | ORAL_TABLET | Freq: Three times a day (TID) | ORAL | Status: DC

## 2012-03-02 MED ORDER — TRIAZOLAM 0.25 MG PO TABS: 0.2500 mg | ORAL_TABLET | Freq: Every evening | ORAL | Status: DC | PRN

## 2012-03-02 MED ORDER — PROMETHAZINE HCL 25 MG PO TABS: 25.0000 mg | ORAL_TABLET | Freq: Four times a day (QID) | ORAL | Status: DC | PRN

## 2012-03-02 NOTE — Progress Notes (Unsigned)
Per pt request, have faxed RX to [facility] to disp: mastectomy supplies

## 2012-03-02 NOTE — Telephone Encounter (Signed)
The pt called with several questions.  She wants to know if she can take her compression wrap off at all.  She had a bilateral mastectomy.  I will ask Dr Jamey Ripa.  She has a rash from the tape.  I told her she can use benadryl cream or hydrocortisone and remove the tape and just use the wrap to hold the dressing in place.  She has some burning sensation and I told her about the nerves that can be cut in surgery and cause burning, numbness or tingling.  This should get better with time.  She is having trouble sleeping at night.  She also gets nauseated and car sick easy and typically takes Dramamine.   She wants a refill on Phenergen because she only got 10 last time.  She also says she doesn't want to get addicted to anything or for Dr Jamey Ripa to think she is but she wants more of the Xanax.  She has 13 left.  She is working on getting a PCP to take care of that.  She knows Dr Jamey Ripa doesn't usually prescribe that. The pt also complained of constipation and she knows it can be from the pain medicine.  She has tried a few laxatives and would like to try an enema.  I told her she can try an enema and I recommended daily Miralax and increased fluid intake.   I spoke to Dr Jamey Ripa and he said she can take the wrap off for comfort.  It is fine to remove the tape.  The nerves are probably causing the burning pain.  She can have #20 of Phenergen and one refill of Xanax.

## 2012-03-03 ENCOUNTER — Telehealth (INDEPENDENT_AMBULATORY_CARE_PROVIDER_SITE_OTHER): Payer: Self-pay | Admitting: General Surgery

## 2012-03-03 NOTE — Telephone Encounter (Signed)
Pt calling from [facility] about camisole.  She states the camisole is much more comfortable than the binder.  Reassured pt that she can wear whichever one she prefers, depending on the level of comfort for the situation.  She understands.

## 2012-03-06 ENCOUNTER — Ambulatory Visit (INDEPENDENT_AMBULATORY_CARE_PROVIDER_SITE_OTHER): Payer: Medicaid Other | Admitting: Surgery

## 2012-03-06 ENCOUNTER — Encounter (INDEPENDENT_AMBULATORY_CARE_PROVIDER_SITE_OTHER): Payer: Self-pay | Admitting: Surgery

## 2012-03-06 ENCOUNTER — Telehealth (INDEPENDENT_AMBULATORY_CARE_PROVIDER_SITE_OTHER): Payer: Self-pay | Admitting: Surgery

## 2012-03-06 VITALS — BP 118/68 | HR 97 | Temp 97.0°F | Resp 18 | Ht 61.0 in | Wt 118.4 lb

## 2012-03-06 DIAGNOSIS — Z09 Encounter for follow-up examination after completed treatment for conditions other than malignant neoplasm: Secondary | ICD-10-CM

## 2012-03-06 DIAGNOSIS — G4700 Insomnia, unspecified: Secondary | ICD-10-CM

## 2012-03-06 DIAGNOSIS — R110 Nausea: Secondary | ICD-10-CM

## 2012-03-06 MED ORDER — PROMETHAZINE HCL 25 MG PO TABS: 25.0000 mg | ORAL_TABLET | Freq: Four times a day (QID) | ORAL | Status: DC | PRN

## 2012-03-06 MED ORDER — OXYCODONE-ACETAMINOPHEN 5-325 MG PO TABS: 1.0000 | ORAL_TABLET | ORAL | Status: DC | PRN

## 2012-03-06 MED ORDER — ALPRAZOLAM 0.5 MG PO TABS: 0.5000 mg | ORAL_TABLET | Freq: Three times a day (TID) | ORAL | Status: DC

## 2012-03-06 NOTE — Progress Notes (Signed)
Kerry Travis    [id number] 03/06/2012    08-09-1970   CC: Post op bilateral mastectomy  HPI: The patient returns for post op follow-up. She underwent a right total and left total plus sln on 02/25/12. Over all she feels that she is doing well.Still a lot of discomfort, anxiety and lack of sleep. Still needs some phenrgan to take with pain meds. Would like to switch to percocet from dilaudid     PE: VITAL SIGNS: BP 118/68  Pulse 97  Temp 97 F (36.1 C) (Oral)  Resp 18  Ht 5\' 1"  (1.549 m)  Wt 118 lb 6.4 oz (53.706 kg)  BMI 22.37 kg/m2  SpO2 100%  LMP 02/11/2012  Right incision looks fine. There is a 4x1 cm ischemic skin on the mid left upper flap.  The drains are almost stopped.  DATA REVIEWED: Pathology report showed Stage II cancer  IMPRESSION: Patient doing well. Drains can come out  PLAN: Her next visit will be in about 10 days. Gave her path and discussed. Will give her some more pain meds, phenergan and xanax She is establishing care with Dr Ivory Broad for primary care

## 2012-03-06 NOTE — Patient Instructions (Signed)
OK to shower starting tomorrow. Keep a sterile gauze over the area on the left that is dark

## 2012-03-06 NOTE — Telephone Encounter (Signed)
Patient called at 5:20 PM.  Apparently, prescriptions from the office visit earlier today were not signed and pharmacy refused to fill them.  I called in Vicodin 5/325 #40.  I called in Xanax 1 mg.  1/2-2 by mouth every 8 hours when necessary anxiety #20

## 2012-03-09 ENCOUNTER — Other Ambulatory Visit (INDEPENDENT_AMBULATORY_CARE_PROVIDER_SITE_OTHER): Payer: Self-pay | Admitting: General Surgery

## 2012-03-09 ENCOUNTER — Other Ambulatory Visit: Payer: Self-pay | Admitting: Oncology

## 2012-03-09 ENCOUNTER — Telehealth (INDEPENDENT_AMBULATORY_CARE_PROVIDER_SITE_OTHER): Payer: Self-pay | Admitting: General Surgery

## 2012-03-09 DIAGNOSIS — G4700 Insomnia, unspecified: Secondary | ICD-10-CM

## 2012-03-09 DIAGNOSIS — R110 Nausea: Secondary | ICD-10-CM

## 2012-03-09 MED ORDER — PROMETHAZINE HCL 25 MG PO TABS: 25.0000 mg | ORAL_TABLET | Freq: Four times a day (QID) | ORAL | Status: DC | PRN

## 2012-03-09 NOTE — Telephone Encounter (Signed)
Pt called in explaining that she has been in pain all weekend because after her mastectomy. Her percocet hasn't been touching the amount of pain she has had over the weekend.  She explained that she had a busy day on Saturday and that she may have over done it.  She has gone back to taking her Dilaudid 2 mg and she is taking 2 at a time.  She explained that this has been working much better but that she only has 3 left and she is leaving for [employer].  She would like to know if she could have a refill of this and her phenergan.  She stressed that she is allergic to Vicodin and that the percocet did not work.

## 2012-03-09 NOTE — Telephone Encounter (Signed)
Called pt and told her that Dr Biagio Quint sent Rx for Phenergan 1 tab 1 6 hrs prn #10 w/o refills to [facility] in [place]

## 2012-03-10 ENCOUNTER — Encounter: Payer: Self-pay | Admitting: *Deleted

## 2012-03-10 NOTE — Progress Notes (Signed)
Received Oncotype Dx results of 18.  Gave copy to MD.  Took copy to Med Rec to scan. 

## 2012-03-11 ENCOUNTER — Telehealth (INDEPENDENT_AMBULATORY_CARE_PROVIDER_SITE_OTHER): Payer: Self-pay | Admitting: General Surgery

## 2012-03-11 NOTE — Telephone Encounter (Signed)
Pt called the answering servers this morning at 7:43 am . She is in [place] ER now with Headache and severe vomiting, she had masty back on 02-25-12. I called her back and spoke with her and she stated that she was waiting on CT Scan of her head and she had pressure around her head last night and did not take any of her pain meds. I told her to wait to see what the CT report stated and the Doctor in the ER will take good care of her. And I also told her once she come back to [place] to give [place] a call and told her about her appt on 10-18 with Dr Jamey Ripa

## 2012-03-13 ENCOUNTER — Telehealth (INDEPENDENT_AMBULATORY_CARE_PROVIDER_SITE_OTHER): Payer: Self-pay | Admitting: General Surgery

## 2012-03-13 NOTE — Telephone Encounter (Signed)
Patient called in stating that she has been dealing with more pain than she expected. Was not sure if it was normal to have that much pain or if she was misled into thinking that she would have been in a better situation by now. Said she still cannot lift her left arm that well. Patient stated that she has stopped taking the Ambien that was issued and the medication that was issued by the hospital in [place] has not been filled because she did not trust taking it. Said she cannot take Vicodin because it makes her violently ill. Said she has not been sleeping well, does not know how many hours of sleep a night that she actually gets and has been constipated for the past four days. Patient said she was in the hospital in [place] two days ago and had a severe headache while there, felt as if her head was going to explode. Patient stated that she does drink coffee every morning and will get headaches if she does not. She stated that she thinks part of the pain she is having is due to anxiety and lack of sleep. If she feels the slightest hint of pain she gets tense in anticipation of it. I advised the patient that it is not unusual for her to have some pain and that I believed her pain was real. Patient also said she has not been able to remove the adhesive that is on her back. Advised to get remover from pharmacy or office supply store. However, I advised the patient that based on what she was telling me that part of her pain perception was due to her anxiety and lack of sleep. I advised the patient she may want to stop drinking coffee as it may be making her more more anxious or sensitive to pain. Also advised the patient to try to relax and breathe deeply when she feels anxious and to try things that may help her relax like a foot massage or watching a movie. Patient advised that the motion in the car and change in altitude may not have helped her, but duration of time in the car to get there and back. Patient stated  agreed the motion of the car helped make her sick. Patient stated that she understood what I was telling her and that she needed to talk. I advised the patient that based on the surgery she had it was not unusual for their to be pain, but every one heals differently and tolerates pain differently. I advised the patient I would forward notation about this call to Dr. Jamey Ripa and Lesly Rubenstein, and for her to keep the appointment to see him next week. The patient really wanted to talk and get out how she felt. I asked her if she felt better now that she got her feelings out and said yes.

## 2012-03-14 ENCOUNTER — Other Ambulatory Visit (INDEPENDENT_AMBULATORY_CARE_PROVIDER_SITE_OTHER): Payer: Self-pay | Admitting: Surgery

## 2012-03-14 DIAGNOSIS — R110 Nausea: Secondary | ICD-10-CM

## 2012-03-14 DIAGNOSIS — C50919 Malignant neoplasm of unspecified site of unspecified female breast: Secondary | ICD-10-CM

## 2012-03-14 DIAGNOSIS — G4700 Insomnia, unspecified: Secondary | ICD-10-CM

## 2012-03-14 MED ORDER — HYDROMORPHONE HCL 2 MG PO TABS: 2.0000 mg | ORAL_TABLET | ORAL | Status: DC | PRN

## 2012-03-14 MED ORDER — PROMETHAZINE HCL 25 MG PO TABS: 25.0000 mg | ORAL_TABLET | Freq: Four times a day (QID) | ORAL | Status: DC | PRN

## 2012-03-16 ENCOUNTER — Encounter: Payer: Self-pay | Admitting: *Deleted

## 2012-03-16 ENCOUNTER — Telehealth: Payer: Self-pay | Admitting: *Deleted

## 2012-03-16 ENCOUNTER — Telehealth (INDEPENDENT_AMBULATORY_CARE_PROVIDER_SITE_OTHER): Payer: Self-pay

## 2012-03-16 NOTE — Telephone Encounter (Signed)
The patient called in asking for help.  She has had a headache and vomiting since yesterday and went to the ER in [facility] last night.  Her boyfriend had to call the EMS.  She thinks it's because of her medications.  She wants someone to go through her med list and tell her what she could stop taking without any severe side effects.  She is on a couple meds for sleep, and anxiety.  She is on Dilaudid for pain.  Two of the medications she said she stopped and threw away.  I believe it was Ambien and Percocet.  She complains of axillary pain at times a level "100" on a scale from 1 to 10.  She has pressure in her chest s/p her mastectomy.  She said her cancer never hurt her but now the pain is unbearable.  Although she wants to come off her meds due to the symptoms she is now having and just take an over the counter med.  She feels hot and sweats a lot but has no fever.  Her affect is flat but she is asking for someone to please help her.  She talked for 19 minutes and repeated herself several times.

## 2012-03-16 NOTE — Telephone Encounter (Signed)
Pt called stating over and over that she wants some "help" and needs someone to tell her which medications to take and not to take.  Pt relate she has been in the ED in [facility] with a H/A of pain sale "1,000" out of 0/10 and projectile vomiting and pain in chest wall and under her arm in "50's" out of 0-10 pain scale.  Pt relate she has thrown away all her medications except for the Effexor.  Informed pt that it was unclear which medications were causing her to have H/A and N/V and if she did have an this problem again to go to the Emergency Room.

## 2012-03-16 NOTE — Telephone Encounter (Signed)
Received call from patient stating she is angry and hurt. She has been through the ED in [facility] last night for severe headaches and vomiting.  She was told it was coming from some of her medications and she wants to know which ones to take and not to take. She states, " I'm scared to take them and scared not to take them."  She states this happened while in [place] with her boyfriend and she stopped all of her medications except for Effexor.   Informed her that since she has stopped all of her medications at one time it would impossible to tell which medication if any had these effects on her.  Informed her that she could be having some migraines and instructed her that I would let Dr. Donnie Coffin know and that if she were to have another episode to go to the Emergency Room.

## 2012-03-16 NOTE — Telephone Encounter (Signed)
I spoke to Dr Jamey Ripa and it is not his expertise to manage her medications.  Maybe Dr Donnie Coffin could help or she needs to find a medical dr.  She was supposed to be working on getting one.  He made sure he is going to see her this week.  She has an appt Friday.  He asked me to talk to [place] and Dawn at the Cancer center to see if they can help with this.  I called the pt back.  She feels like no one is helping her.  She said she has an appt with a medical md and she called their office to get advice on her medications.  They told her they do not manage pain medicine.  She called the pharmacy and they told her to call the surgeon.  I tried to tell her what Dr Jamey Ripa said but she kept going on about what she has been through and that her boyfriend is losing his job because of her and their dog is out of food.  She states she has been laying in her own filfth for two days.  I told her I has sent a message to the cancer center and I was trying to get her help.  She said she doesn't understand why no one will just tell her what meds she can stop without a severe consequence.  Although she says she tossed the Ambien, Halcion, Percocet.  She says she doesn't overmedicate but that her boyfriend lays out her meds.  After we talked for about 14 minutes she said she was going to lay down because it was too much.  I had tried to ask her address so I can send EMS to check on her but she only has a po box.  She states she doesn't have a street #.  She lives in [place].

## 2012-03-17 ENCOUNTER — Other Ambulatory Visit: Payer: Self-pay | Admitting: *Deleted

## 2012-03-17 DIAGNOSIS — C50419 Malignant neoplasm of upper-outer quadrant of unspecified female breast: Secondary | ICD-10-CM

## 2012-03-18 ENCOUNTER — Telehealth: Payer: Self-pay | Admitting: *Deleted

## 2012-03-18 ENCOUNTER — Ambulatory Visit (HOSPITAL_BASED_OUTPATIENT_CLINIC_OR_DEPARTMENT_OTHER): Payer: Medicaid Other | Admitting: Oncology

## 2012-03-18 ENCOUNTER — Other Ambulatory Visit (HOSPITAL_BASED_OUTPATIENT_CLINIC_OR_DEPARTMENT_OTHER): Payer: Medicaid Other | Admitting: Lab

## 2012-03-18 VITALS — BP 101/66 | HR 80 | Temp 97.9°F | Resp 20 | Ht 61.0 in | Wt 116.0 lb

## 2012-03-18 DIAGNOSIS — R112 Nausea with vomiting, unspecified: Secondary | ICD-10-CM

## 2012-03-18 DIAGNOSIS — Z170 Estrogen receptor positive status [ER+]: Secondary | ICD-10-CM

## 2012-03-18 DIAGNOSIS — C50419 Malignant neoplasm of upper-outer quadrant of unspecified female breast: Secondary | ICD-10-CM

## 2012-03-18 DIAGNOSIS — F411 Generalized anxiety disorder: Secondary | ICD-10-CM

## 2012-03-18 LAB — COMPREHENSIVE METABOLIC PANEL (CC13)
ALT: 16 U/L (ref 0–55)
AST: 13 U/L (ref 5–34)
Alkaline Phosphatase: 55 U/L (ref 40–150)
Glucose: 81 mg/dl (ref 70–99)
Potassium: 4.2 mEq/L (ref 3.5–5.1)
Sodium: 138 mEq/L (ref 136–145)
Total Bilirubin: 0.3 mg/dL (ref 0.20–1.20)
Total Protein: 6.7 g/dL (ref 6.4–8.3)

## 2012-03-18 LAB — CBC WITH DIFFERENTIAL/PLATELET
BASO%: 0.4 % (ref 0.0–2.0)
EOS%: 0.5 % (ref 0.0–7.0)
LYMPH%: 21.5 % (ref 14.0–49.7)
MCHC: 33.8 g/dL (ref 31.5–36.0)
MCV: 90.4 fL (ref 79.5–101.0)
MONO%: 6.9 % (ref 0.0–14.0)
Platelets: 348 10*3/uL (ref 145–400)
RBC: 3.73 10*6/uL (ref 3.70–5.45)
RDW: 14.6 % — ABNORMAL HIGH (ref 11.2–14.5)

## 2012-03-18 MED ORDER — TAMOXIFEN CITRATE 20 MG PO TABS: 20.0000 mg | ORAL_TABLET | Freq: Every day | ORAL | Status: DC

## 2012-03-18 NOTE — Telephone Encounter (Signed)
Mailed out calendar to inform the patient of the new date and time 

## 2012-03-20 ENCOUNTER — Ambulatory Visit (INDEPENDENT_AMBULATORY_CARE_PROVIDER_SITE_OTHER): Payer: Medicaid Other | Admitting: Surgery

## 2012-03-20 ENCOUNTER — Ambulatory Visit (HOSPITAL_COMMUNITY)
Admission: RE | Admit: 2012-03-20 | Discharge: 2012-03-20 | Disposition: A | Discharge status: Home / Self Care | Payer: Medicaid Other | Source: Ambulatory Visit | Attending: Surgery | Admitting: Surgery

## 2012-03-20 ENCOUNTER — Encounter (INDEPENDENT_AMBULATORY_CARE_PROVIDER_SITE_OTHER): Payer: Self-pay | Admitting: Surgery

## 2012-03-20 ENCOUNTER — Telehealth (INDEPENDENT_AMBULATORY_CARE_PROVIDER_SITE_OTHER): Payer: Self-pay

## 2012-03-20 VITALS — BP 100/56 | HR 84 | Temp 98.1°F | Resp 20 | Ht 61.0 in | Wt 117.0 lb

## 2012-03-20 DIAGNOSIS — M7989 Other specified soft tissue disorders: Secondary | ICD-10-CM | POA: Insufficient documentation

## 2012-03-20 DIAGNOSIS — M79609 Pain in unspecified limb: Secondary | ICD-10-CM

## 2012-03-20 DIAGNOSIS — R110 Nausea: Secondary | ICD-10-CM

## 2012-03-20 DIAGNOSIS — Z9010 Acquired absence of unspecified breast and nipple: Secondary | ICD-10-CM | POA: Insufficient documentation

## 2012-03-20 DIAGNOSIS — F419 Anxiety disorder, unspecified: Secondary | ICD-10-CM | POA: Insufficient documentation

## 2012-03-20 DIAGNOSIS — M79604 Pain in right leg: Secondary | ICD-10-CM

## 2012-03-20 DIAGNOSIS — G4700 Insomnia, unspecified: Secondary | ICD-10-CM

## 2012-03-20 DIAGNOSIS — F411 Generalized anxiety disorder: Secondary | ICD-10-CM

## 2012-03-20 DIAGNOSIS — M79606 Pain in leg, unspecified: Secondary | ICD-10-CM

## 2012-03-20 MED ORDER — ALPRAZOLAM 0.5 MG PO TABS: 0.5000 mg | ORAL_TABLET | Freq: Three times a day (TID) | ORAL | Status: DC

## 2012-03-20 NOTE — Telephone Encounter (Signed)
Kerry Travis from vascular lab called report. Negative for DVT. Patient given results and sent home.

## 2012-03-20 NOTE — Patient Instructions (Addendum)
We will order a test to be sure there is not a blood clot developing in your leg. Please go to the vascular lab today at [facility] now.

## 2012-03-20 NOTE — Progress Notes (Signed)
VASCULAR LAB PRELIMINARY  PRELIMINARY  PRELIMINARY  PRELIMINARY  Right lower exrtemity venous duplex completed.    Preliminary report:  Right lower extremity is negative for deep and superficial vein thrombosis.  Report called to [facility] in office.  Norlan Rann, 03/20/2012, 2:45 PM

## 2012-03-20 NOTE — Progress Notes (Signed)
Kerry Travis    [id number] 03/20/2012    February 26, 1971   CC: Post op bilateral mastectomy  HPI: The patient returns for post op follow-up. She underwent a right total and left total plus sln on 02/25/12. Since her last visit she's had a lot of problems with headaches medications nausea et Karie Soda. She spent a hospital emergency room twice as bad headaches and vomiting. This was determined that as she puts it "the medications are building up in my system. "she has decided to stop all her medications except Xanax and ibuprofen. She continues tests of pain in the breast areas as well as the axilla but perhaps a little bit better. Some of the somewhat burning. She has not noticed any fluid. She is not able to look at her incisions yet.  She also notes some discomfort in her right calf and was worried about a blood clot.     PE: VITAL SIGNS: BP 100/56  Pulse 84  Temp 98.1 F (36.7 C) (Temporal)  Resp 20  Ht 5\' 1"  (1.549 m)  Wt 117 lb (53.071 kg)  BMI 22.11 kg/m2  Right incision looks fine. There is a 4x1 cm ischemic skin on the mid left upper flap.I think is going to resolve. Extremities she has a negative Homans sign. There is no swelling of the right extremity.    DATA REVIEWED: Pathology report showed Stage II cancer  IMPRESSION: Stable postop, but with a little but more pain than normal.; Continued anxiety issues.  PLAN: Will refill her Xanax. We'll see her back in 2 weeks. We'll arrange a Doppler to rule out DVT today.   Addendum: Doppler negative for DVT

## 2012-03-26 ENCOUNTER — Telehealth (INDEPENDENT_AMBULATORY_CARE_PROVIDER_SITE_OTHER): Payer: Self-pay | Admitting: General Surgery

## 2012-03-26 DIAGNOSIS — F419 Anxiety disorder, unspecified: Secondary | ICD-10-CM

## 2012-03-26 DIAGNOSIS — R110 Nausea: Secondary | ICD-10-CM

## 2012-03-26 DIAGNOSIS — G4700 Insomnia, unspecified: Secondary | ICD-10-CM

## 2012-03-26 MED ORDER — ALPRAZOLAM 0.5 MG PO TABS: 0.5000 mg | ORAL_TABLET | Freq: Three times a day (TID) | ORAL | Status: DC

## 2012-03-26 NOTE — Telephone Encounter (Signed)
Patient calling to change her appt from 04/07/12 to 04/15/12 because she is going out of town next week. Appt changed. She also requested a refill of her xanax. Dr Alvia Grove one more refill and this was called to [facility] making patient aware.

## 2012-04-02 ENCOUNTER — Telehealth (INDEPENDENT_AMBULATORY_CARE_PROVIDER_SITE_OTHER): Payer: Self-pay | Admitting: General Surgery

## 2012-04-02 NOTE — Telephone Encounter (Signed)
My last conversation with the patient she stated she had tried to get an appt with several primary MD's and no one would see her. I called Dr Hamrick's office in [place] for the patient. They stated that the patient is scheduled to see her tomorrow and saw the PA Lonie Peak in June 2013. I advised I would document that in our chart that she has a PCP.

## 2012-04-07 ENCOUNTER — Encounter (INDEPENDENT_AMBULATORY_CARE_PROVIDER_SITE_OTHER): Payer: Medicaid Other | Admitting: Surgery

## 2012-04-15 ENCOUNTER — Encounter (INDEPENDENT_AMBULATORY_CARE_PROVIDER_SITE_OTHER): Payer: Medicaid Other | Admitting: Surgery

## 2012-04-21 ENCOUNTER — Encounter (INDEPENDENT_AMBULATORY_CARE_PROVIDER_SITE_OTHER): Payer: Medicaid Other | Admitting: Surgery

## 2012-04-27 ENCOUNTER — Telehealth (INDEPENDENT_AMBULATORY_CARE_PROVIDER_SITE_OTHER): Payer: Self-pay | Admitting: General Surgery

## 2012-04-27 NOTE — Telephone Encounter (Signed)
Message copied by Liliana Cline on Mon Apr 27, 2012 12:04 PM ------      Message from: Rise Paganini      Created: Mon Apr 27, 2012 11:52 AM      Regarding: Tomasita Crumble: [phone]       Patient has missed a couple of appointments.  I called to reschedule her appointment and this is what I was told. She stated that she has an appointment with the plastic surgeon on Friday and that she's not sure if Dr. Jamey Ripa has to release her or not. She also stated that she plans to move to [place] very soon and didn't feel it was necessary to be seen. This is just for your information. Thank you.

## 2012-04-29 ENCOUNTER — Encounter (INDEPENDENT_AMBULATORY_CARE_PROVIDER_SITE_OTHER): Payer: Self-pay | Admitting: Surgery

## 2012-05-11 ENCOUNTER — Other Ambulatory Visit: Payer: Self-pay | Admitting: *Deleted

## 2012-05-11 DIAGNOSIS — C50919 Malignant neoplasm of unspecified site of unspecified female breast: Secondary | ICD-10-CM

## 2012-05-11 DIAGNOSIS — F419 Anxiety disorder, unspecified: Secondary | ICD-10-CM

## 2012-05-11 MED ORDER — ZOLPIDEM TARTRATE 10 MG PO TABS: 10.0000 mg | ORAL_TABLET | Freq: Every evening | ORAL | Status: AC | PRN

## 2012-05-13 ENCOUNTER — Telehealth (INDEPENDENT_AMBULATORY_CARE_PROVIDER_SITE_OTHER): Payer: Self-pay | Admitting: General Surgery

## 2012-05-13 NOTE — Telephone Encounter (Signed)
She needs long term breast cancer follow up with either me or a medical oncologist.  I don't know what a "letter of release is."

## 2012-05-13 NOTE — Telephone Encounter (Signed)
Message copied by Liliana Cline on Wed May 13, 2012  8:44 AM ------      Message from: Zacarias Pontes      Created: Wed May 13, 2012  8:33 AM       Pt states she called last week and wants to be released from having return apts with Dr Jamey Ripa.She said she is doing fine and doesn't feel the need to come back in and needs a letter of release.she would like a call back and a letter! her # is [phone] and her correct address is the [address] [place] [address]..she says that she has a lot going on in her life and this is important to her

## 2012-05-13 NOTE — Telephone Encounter (Signed)
Called pt in regards to being released from CS care.  I told the pt that she could not be released prior to seeing CS.  Made an appt for pt for 05/19/12 at 4:00pm

## 2012-05-13 NOTE — Telephone Encounter (Signed)
Patient has no showed and cancelled her last three appts with [place]. She was last seen in October. I had told her before Dr Jamey Ripa wanted to see her again. Dr Jamey Ripa- do you want to release patient or does she need to keep her follow ups to be checked again?

## 2012-05-19 ENCOUNTER — Ambulatory Visit (INDEPENDENT_AMBULATORY_CARE_PROVIDER_SITE_OTHER): Payer: Medicaid Other | Admitting: Surgery

## 2012-05-19 ENCOUNTER — Encounter (INDEPENDENT_AMBULATORY_CARE_PROVIDER_SITE_OTHER): Payer: Self-pay | Admitting: Surgery

## 2012-05-19 VITALS — BP 110/66 | HR 64 | Temp 98.0°F | Resp 12 | Ht 61.5 in | Wt 116.8 lb

## 2012-05-19 DIAGNOSIS — Z853 Personal history of malignant neoplasm of breast: Secondary | ICD-10-CM

## 2012-05-19 NOTE — Progress Notes (Signed)
Chief complaint: Postop bilateral mastectomies for left-sided breast cancer with prophylactic right mastectomy.  History of present illness: This patient surgery 02/25/2012. She continues to have some discomfort laterally on the left side where the dissection was done. Other than that he seems to be doing okay but she is having a lot of social issues. She is planning to move back to [place] soon.  Exam: Vital signs:BP 110/66  Pulse 64  Temp 98 F (36.7 C) (Temporal)  Resp 12  Ht 5' 1.5" (1.562 m)  Wt 116 lb 12.8 oz (52.98 kg)  BMI 21.71 kg/m2  LMP 05/03/2012 Gen.: Patient alert oriented, thin, somewhat sad mental status Breasts: She is status post bilateral mastectomies. There is no evidence of any recurrences or other problems. All incisions have healed nicely. She has excellent range of motion of both shoulders.  Impression: Stable exam  Plan: I told her she needs to be examined twice a year. We gave her a copy of the pathology report to take with her for her physicians and she goes back to [place]. She'll come back here if any problems develop in the meantime.

## 2012-05-19 NOTE — Patient Instructions (Signed)
You should have someone examine you twice a year to be sure there is no recurrence of her breast cancer

## 2012-06-06 ENCOUNTER — Other Ambulatory Visit: Payer: Self-pay | Admitting: Oncology

## 2012-06-08 ENCOUNTER — Other Ambulatory Visit: Payer: Self-pay | Admitting: *Deleted
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
## 2012-06-11 ENCOUNTER — Telehealth: Payer: Self-pay | Admitting: *Deleted
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
## 2012-06-30 NOTE — Progress Notes (Signed)
Hematology and Oncology Follow Up Visit  Kerry Travis [id number] Apr 11, 1971 41 y.o. 03/18/12  DIAGNOSIS:   Encounter Diagnosis  Name Primary?  . Breast cancer, Left, UOQ,  Yes     PAST THERAPY:  Hx bilateral mastectomy  02/25/12 for stage II breast cancer (2.1 cm , grade I, ER/PR+), N0  Interim History:  She has been having headaches post op , overall  She has done well. She did have an oncotype sent, which came back showing a risk of recurrence of 11%.  Of note she has been having some n/v, likely from the anaesthesia.  Medications: I have reviewed the patient's current medications.  Allergies:  Allergies  Allergen Reactions  . Vicodin (Hydrocodone-Acetaminophen) Nausea And Vomiting    Past Medical History, Surgical history, Social history, and Family History were reviewed and updated.  Review of Systems: Constitutional:  Negative for fever, chills, night sweats, anorexia, weight loss, pain. Cardiovascular: no chest pain or dyspnea on exertion Respiratory: no cough, shortness of breath, or wheezing Neurological: positive for - headaches Dermatological: negative ENT: negative Skin Gastrointestinal: no abdominal pain, change in bowel habits, or black or bloody stools Genito-Urinary: no dysuria, trouble voiding, or hematuria Hematological and Lymphatic: negative Breast: negative Musculoskeletal: negative Remaining ROS negative.  Physical Exam:  Blood pressure 101/66, pulse 80, temperature 97.9 F (36.6 C), resp. rate 20, height 5\' 1"  (1.549 m), weight 116 lb (52.617 kg).  ECOG: 1 HEENT:  Sclerae anicteric, conjunctivae pink.  Oropharynx clear.  No mucositis or candidiasis.  Nodes:  No cervical, supraclavicular, or axillary lymphadenopathy palpated.  Breast Exam: s/p bilateral mrm.  Lungs:  Clear to auscultation bilaterally.  No crackles, rhonchi, or wheezes.  Heart:  Regular rate and rhythm.  Abdomen:  Soft, nontender.  Positive bowel sounds.  No organomegaly or masses  palpated.  Musculoskeletal:  No focal spinal tenderness to palpation.  Extremities:  Benign.  No peripheral edema or cyanosis.  Skin:  Benign.  Neuro:  Nonfocal.       Lab Results: Lab Results  Component Value Date   WBC 7.9 03/18/2012   HGB 11.4* 03/18/2012   HCT 33.7* 03/18/2012   MCV 90.4 03/18/2012   PLT 348 03/18/2012     Chemistry      Component Value Date/Time   NA 138 03/18/2012 1026   NA 141 02/26/2012 0500   K 4.2 03/18/2012 1026   K 3.3* 02/26/2012 0500   CL 106 03/18/2012 1026   CL 105 02/26/2012 0500   CO2 23 03/18/2012 1026   CO2 26 02/26/2012 0500   BUN 11.0 03/18/2012 1026   BUN 4* 02/26/2012 0500   CREATININE 0.7 03/18/2012 1026   CREATININE 0.50 02/26/2012 0500      Component Value Date/Time   CALCIUM 9.8 03/18/2012 1026   CALCIUM 9.0 02/26/2012 0500   ALKPHOS 55 03/18/2012 1026   ALKPHOS 78 01/08/2012 0901   AST 13 03/18/2012 1026   AST 13 01/08/2012 0901   ALT 16 03/18/2012 1026   ALT 10 01/08/2012 0901   BILITOT 0.30 03/18/2012 1026   BILITOT 0.1* 01/08/2012 0901       Radiological Studies:  No results found.   IMPRESSIONS AND PLAN: A 42 y.o. 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 We discussed adjuvant therapy and genetic testing.she is premenopausal and will be started on tamoxifen. We discussed side effects and she will call if she has concerns. She has ongoing anxiety issues which need  to be dealt with as well. She has appointments with clinical psychologists.  Spent more than half the time coordinating care, as well as discussion of BMI and its implications.      Kerry Travis 1/28/201411:18 AM Cell [id number]

## 2012-07-21 ENCOUNTER — Ambulatory Visit: Payer: Medicaid Other | Admitting: Oncology

## 2012-07-21 ENCOUNTER — Other Ambulatory Visit: Payer: Medicaid Other | Admitting: Lab

## 2012-10-07 ENCOUNTER — Telehealth (HOSPITAL_COMMUNITY): Payer: Self-pay | Admitting: *Deleted
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
## 2012-12-31 ENCOUNTER — Telehealth: Payer: Self-pay | Admitting: *Deleted

## 2012-12-31 NOTE — Telephone Encounter (Signed)
Left message for a return phone call to schedule with a new provider.  Awaiting patient response.

## 2013-01-01 ENCOUNTER — Encounter: Payer: Self-pay | Admitting: *Deleted

## 2013-01-01 NOTE — Progress Notes (Signed)
Mailed letter for pt to call and schedule with new provider.

## 2013-05-05 IMAGING — US US BREAST BILAT
1 series · 13 of 15 positions shown · non-contrast
Comparison: None

CLINICAL DATA: The patient has felt two lumps in the outer portion
of the right breast for 1 month.  She has had bilateral spontaneous
milky discharge for 24 years.  Her mother died at age 56 from
breast cancer.  Her grandmother and maternal aunt had breast
cancer.  The patient had a benign needle biopsy of the left lower
outer quadrant 1 year and 3 months ago in [REDACTED], [HOSPITAL].

DIGITAL DIAGNOSTIC BILATERAL MAMMOGRAM WITH CAD AND BILATERAL
BREAST ULTRASOUND:

[Series 1: us breast bilat · 13 of 15 slices shown]
[im 1/15]
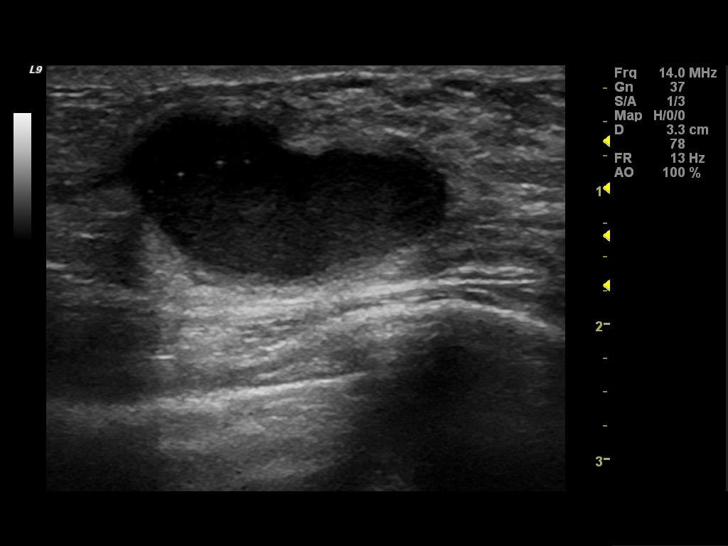
[im 2/15]
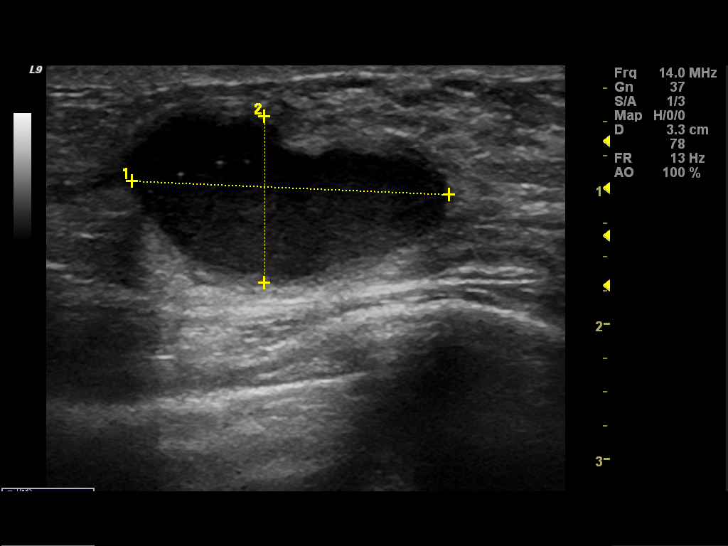
[im 3/15]
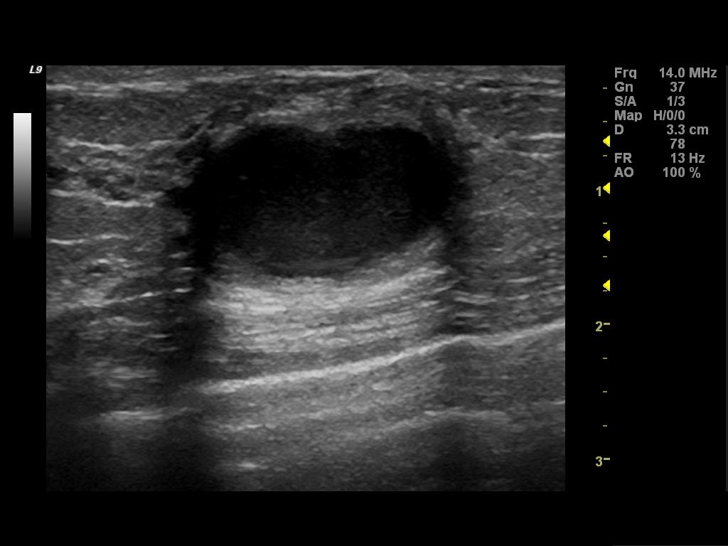
[im 5/15]
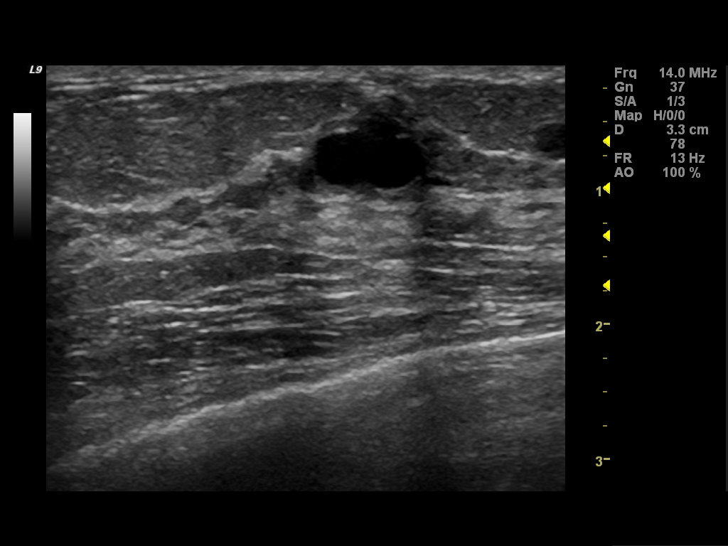
[im 6/15]
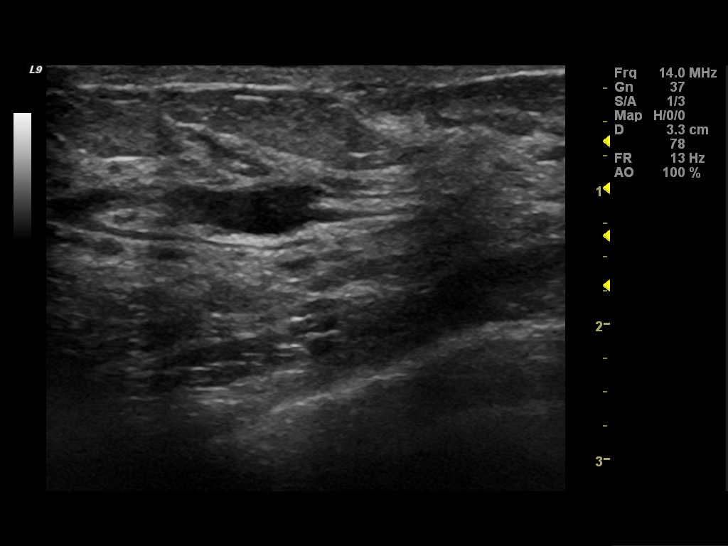
[im 7/15]
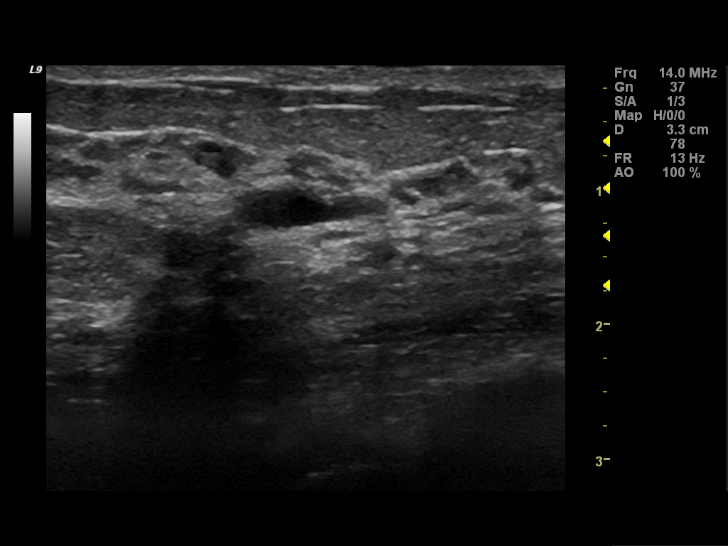
[im 8/15]
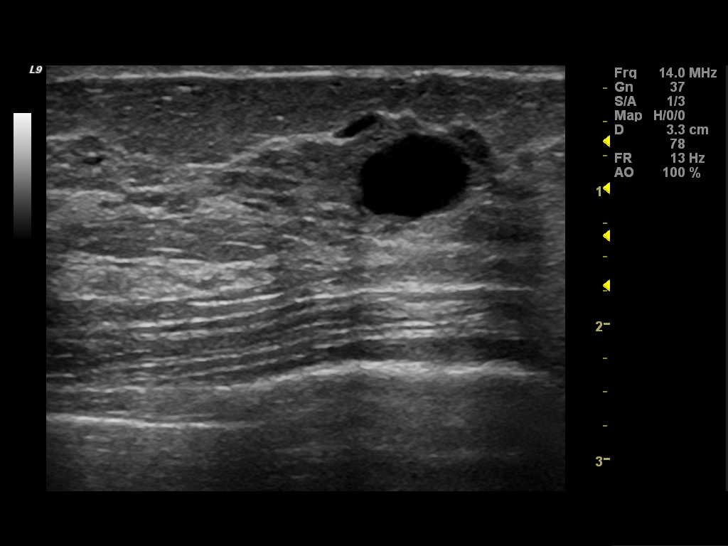
[im 9/15]
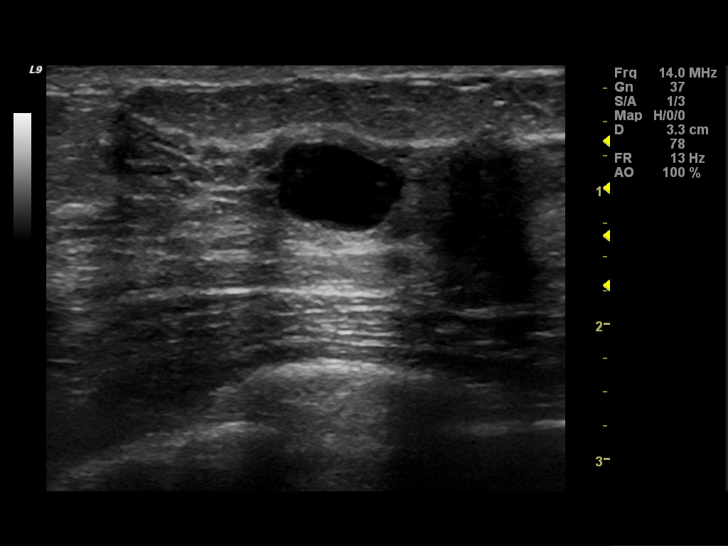
[im 10/15]
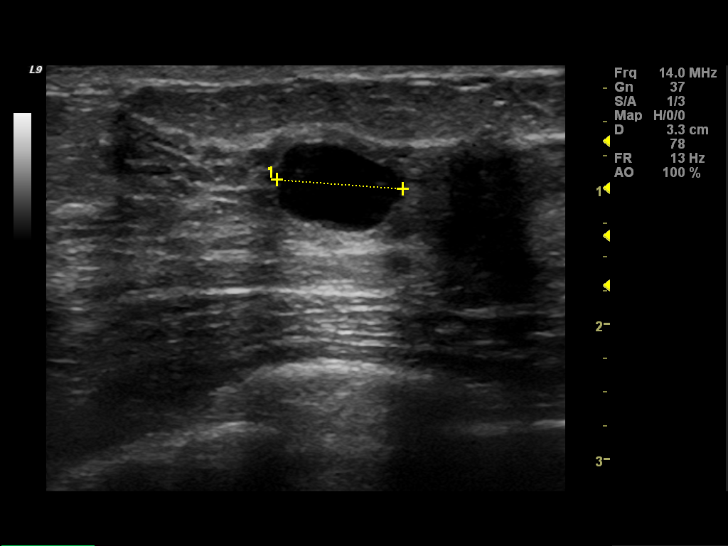
[im 11/15]
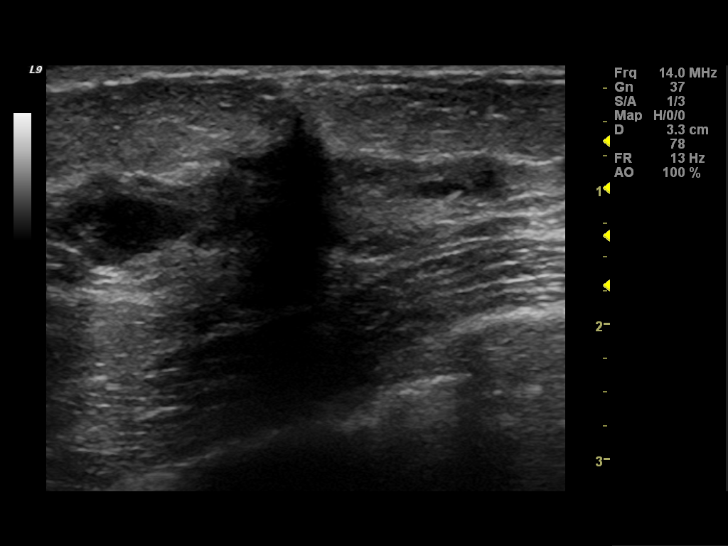
[im 13/15]
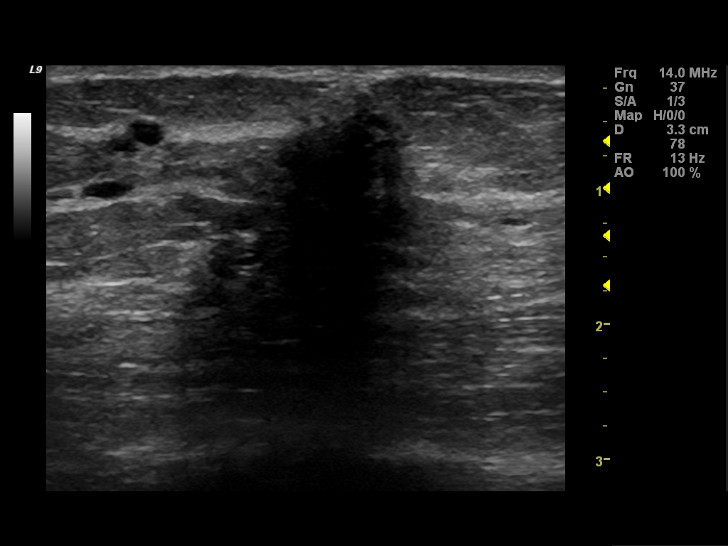
[im 14/15]
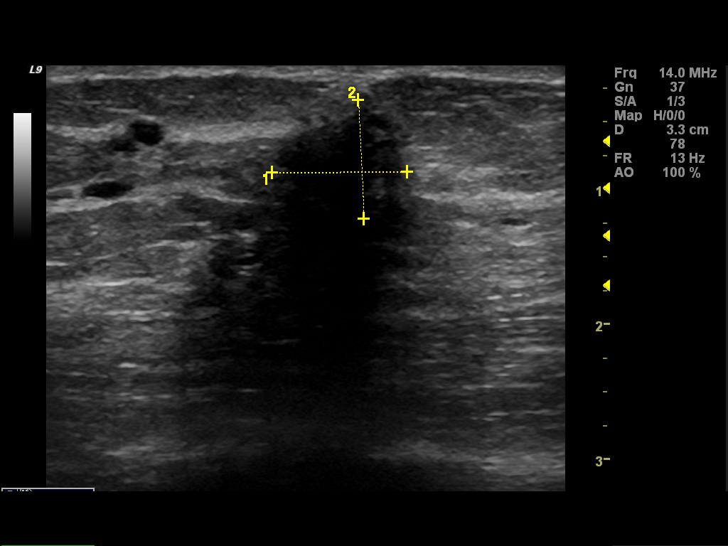
[im 15/15]
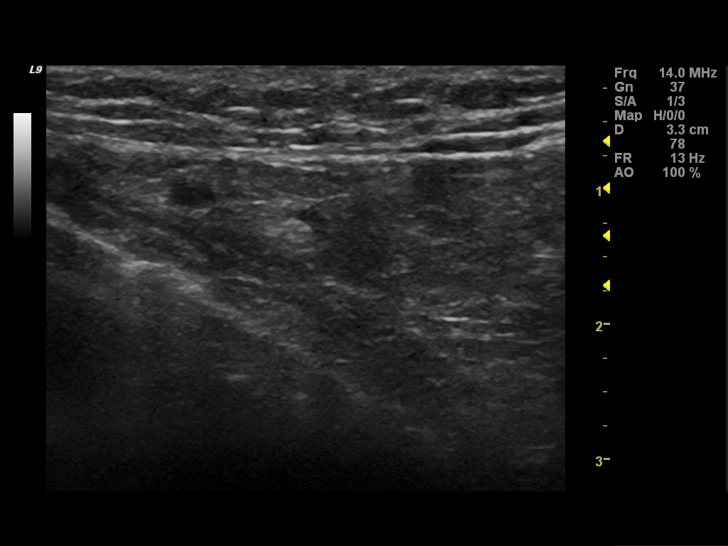

[13 of 15 positions shown; findings below may reference images not displayed]

FINDINGS: The breast tissue is heterogeneously dense.  There are
two adjacent oval masses in the 9 o'clock position of the right
breast posteriorly which correspond to the palpable finding.  There
are multiple partially obscured partially circumscribed masses
scattered throughout both breasts.  There is  a spiculated mass in
the left upper outer quadrant posteriorly which contains some fine
microcalcifications.  This is approximately 8 x 10 x 10 mm in size
Mammographic images were processed with CAD.

On physical exam, I palpate a 2 cm oval mass at 9 o'clock 7 cm from
the right nipple.  No mass is palpated on the left.

Ultrasound is performed, showing a cyst at 9 o'clock 7 cm from the
right nipple measuring 1.8 x 2.4 x 1.2 cm.  There are multiple
cysts scattered throughout the remainder of the right breast and
the left breast.  These correspond to the mammographic nodules.

There is an irregular mass with intense posterior shadowing at 1:30
o'clock 5 cm from the left nipple measuring 1.0 x 0.9 x 0.9 cm.
This corresponds to the spiculated mass noted mammographically. No
abnormal nodes are noted in the left axilla.  The appearance is
worrisome for invasive mammary carcinoma. Biopsy is recommended.
IMPRESSION: 1.  Suspicious mass in the left upper outer quadrant at 1:30
o'clock, 5 cm from the left nipple.
2.  The palpable mass in the right breast at 9 o'clock corresponds
to a cyst.
3.  Multiple bilateral benign cysts.

RECOMMENDATION:
Ultrasound-guided core needle biopsy of the mass in the left upper
outer quadrant is recommended.   Prior studies have also been
requested for comparison.  The patient will be evaluated by the
Breast and Cervical Cancer Control Program.  Biopsy will be
scheduled after this is done.

BI-RADS CATEGORY 4:  Suspicious abnormality - biopsy should be
considered.

## 2013-11-22 ENCOUNTER — Telehealth: Payer: Self-pay | Admitting: *Deleted

## 2013-11-22 NOTE — Telephone Encounter (Signed)
Patient report calling this office since 11-17-2013 for a refill on tamoxifen.  Has been in [place] the past year and returned.  Instructed to call Bartolo Montanye in [place] as she was last seen in this office October 16-2013 by Dr. Eston Esters.  Instructed to obtain refill from current Allahna Husband and request records be sent to [facility] As a new referral.  Inquired how she has obtained prior refills and reports she "hasn't needed any refills because I don't take tamoxifen as prescribed.  I take one pill every three days or so."  Does not accept this nurses instructions stating [facility] won't call [facility] without a doctor's name to send the records to.  Informed her she'll have to be re-assigned and call transferred to [facility] new patient coordinator for further assist.

## 2013-12-08 ENCOUNTER — Encounter: Payer: Self-pay | Admitting: *Deleted

## 2013-12-08 NOTE — Progress Notes (Signed)
This RN received records per fax on this pt from Dr Lemmie Evens with no cover sheet - just records.  Per note from triage RN on 11/22/2013 - pt called this office to obtain refill on tamoxifen post not being seen for one and half years.  Records will be taken to HIM for appropriate follow up.

## 2018-03-20 DIAGNOSIS — Z853 Personal history of malignant neoplasm of breast: Secondary | ICD-10-CM

## 2018-03-20 DIAGNOSIS — R0789 Other chest pain: Secondary | ICD-10-CM

## 2018-03-20 DIAGNOSIS — F419 Anxiety disorder, unspecified: Secondary | ICD-10-CM

## 2018-03-20 DIAGNOSIS — H6093 Unspecified otitis externa, bilateral: Secondary | ICD-10-CM

## 2018-03-20 DIAGNOSIS — Z9013 Acquired absence of bilateral breasts and nipples: Secondary | ICD-10-CM

## 2021-01-03 ENCOUNTER — Encounter: Payer: Self-pay | Admitting: Gastroenterology

## 2021-02-09 ENCOUNTER — Encounter: Payer: Self-pay | Admitting: Gastroenterology

## 2021-02-09 ENCOUNTER — Other Ambulatory Visit (INDEPENDENT_AMBULATORY_CARE_PROVIDER_SITE_OTHER): Payer: Medicaid Other

## 2021-02-09 ENCOUNTER — Ambulatory Visit (INDEPENDENT_AMBULATORY_CARE_PROVIDER_SITE_OTHER): Payer: Medicaid Other | Admitting: Gastroenterology

## 2021-02-09 VITALS — BP 108/50 | HR 68 | Ht 61.0 in | Wt 133.0 lb

## 2021-02-09 DIAGNOSIS — K581 Irritable bowel syndrome with constipation: Secondary | ICD-10-CM | POA: Diagnosis not present

## 2021-02-09 DIAGNOSIS — R630 Anorexia: Secondary | ICD-10-CM

## 2021-02-09 DIAGNOSIS — G8929 Other chronic pain: Secondary | ICD-10-CM

## 2021-02-09 DIAGNOSIS — K219 Gastro-esophageal reflux disease without esophagitis: Secondary | ICD-10-CM

## 2021-02-09 DIAGNOSIS — R1032 Left lower quadrant pain: Secondary | ICD-10-CM | POA: Diagnosis not present

## 2021-02-09 DIAGNOSIS — R110 Nausea: Secondary | ICD-10-CM

## 2021-02-09 DIAGNOSIS — R140 Abdominal distension (gaseous): Secondary | ICD-10-CM | POA: Diagnosis not present

## 2021-02-09 DIAGNOSIS — R1319 Other dysphagia: Secondary | ICD-10-CM

## 2021-02-09 DIAGNOSIS — K5909 Other constipation: Secondary | ICD-10-CM

## 2021-02-09 LAB — COMPREHENSIVE METABOLIC PANEL
ALT: 14 U/L (ref 0–35)
AST: 16 U/L (ref 0–37)
Albumin: 4.1 g/dL (ref 3.5–5.2)
Alkaline Phosphatase: 40 U/L (ref 39–117)
BUN: 10 mg/dL (ref 6–23)
CO2: 30 mEq/L (ref 19–32)
Calcium: 9.4 mg/dL (ref 8.4–10.5)
Chloride: 104 mEq/L (ref 96–112)
Creatinine, Ser: 0.65 mg/dL (ref 0.40–1.20)
GFR: 103.73 mL/min (ref >60.00)
Glucose, Bld: 86 mg/dL (ref 70–99)
Potassium: 3.8 mEq/L (ref 3.5–5.1)
Sodium: 140 mEq/L (ref 135–145)
Total Bilirubin: 0.3 mg/dL (ref 0.2–1.2)
Total Protein: 6.8 g/dL (ref 6.0–8.3)

## 2021-02-09 LAB — CBC
HCT: 35.2 % — ABNORMAL LOW (ref 36.0–46.0)
Hemoglobin: 12.2 g/dL (ref 12.0–15.0)
MCHC: 34.6 g/dL (ref 30.0–36.0)
MCV: 92.2 fl (ref 78.0–100.0)
Platelets: 226 10*3/uL (ref 150.0–400.0)
RBC: 3.82 Mil/uL — ABNORMAL LOW (ref 3.87–5.11)
RDW: 12.4 % (ref 11.5–15.5)
WBC: 6.9 10*3/uL (ref 4.0–10.5)

## 2021-02-09 LAB — LIPASE: Lipase: 23 U/L (ref 11.0–59.0)

## 2021-02-09 LAB — CORTISOL: Cortisol, Plasma: 7.8 ug/dL

## 2021-02-09 LAB — C-REACTIVE PROTEIN: CRP: <1 mg/dL (ref 0.5–20.0)

## 2021-02-09 LAB — SEDIMENTATION RATE: Sed Rate: 8 mm/hr (ref 0–20)

## 2021-02-09 MED ORDER — LINACLOTIDE 145 MCG PO CAPS: 145.0000 ug | ORAL_CAPSULE | Freq: Every day | ORAL | 2 refills | Status: DC

## 2021-02-09 MED ORDER — NA SULFATE-K SULFATE-MG SULF 17.5-3.13-1.6 GM/177ML PO SOLN: 1.0000 | ORAL | 0 refills | Status: DC

## 2021-02-09 NOTE — Patient Instructions (Addendum)
Your provider has requested that you go to the basement level for lab work before leaving today. Press "B" on the elevator. The lab is located at the first door on the left as you exit the elevator.   You have been scheduled for an endoscopy and colonoscopy. Please follow the written instructions given to you at your visit today. Please pick up your prep supplies at the pharmacy within the next 1-3 days. If you use inhalers (even only as needed), please bring them with you on the day of your procedure.  We have sent the following medications to your pharmacy for you to pick up at your convenience: Suprep and Linzess   Take Linzess 145m :1 capsule by mouth daily. Also take Dulcolax every other day to help with constipation.   Try Pepcid (over the counter)  as needed to help with Acid Reflux.  You have been scheduled [facility] CT [facility]. You are scheduled on 02/16/21  at 3:00pm. You should arrive 15 minutes prior to your appointment time for registration. .The solution may taste better if refrigerated, but do NOT add ice or any other liquid to this solution. Shake well before drinking.   Please follow the written instructions below on the day of your exam:   1) Do not eat anything after 11:00am (4 hours prior to your test)   2) Drink 1 bottle of contrast @ 1:00pm (2 hours prior to your exam)  Remember to shake well before drinking and do NOT pour over ice.     Drink 1 bottle of contrast @ 2:00pm (1 hour prior to your exam)   You may take any medications as prescribed with a small amount of water, if necessary. If you take any of the following medications: METFORMIN, GLUCOPHAGE, GLUCOVANCE, AVANDAMET, RIOMET, FORTAMET, AB[place]MET, JANUMET, GLUMETZA or METAGLIP, you MAY be asked to HOLD this medication 48 hours AFTER the exam.   The purpose of you drinking the oral contrast is to aid in the visualization of your intestinal tract.  The contrast solution may cause some diarrhea. Depending on your individual set of symptoms, you may also receive an intravenous injection of x-ray contrast/dye. [facility] on [facility] 45 minutes or longer, depending on the type of exam you are having performed.   If you have any questions regarding your exam or if you need to reschedule, you may call [facility] at [phone] the hours of 8:00 am and 5:00 pm, Monday-Friday.   Thank you for choosing me and [facility].  Dr. MRush Landmark

## 2021-02-09 NOTE — Progress Notes (Signed)
[place] VISIT   Primary Care Provider Barren, Lorin Mercy, MD [place] [id number] [phone]  Referring Provider Hamrick, Lorin Mercy, MD [address] [place],  [place] [address] [phone]  Patient Profile: Kerry Travis is a 50 y.o. female with a pmh significant for prior breast cancer, anxiety, MDD, seizures, seasonal allergies, GERD.  The patient presents to the [facility] for an evaluation and management of problem(s) noted below:  Problem List 1. Chronic constipation   2. Chronic LLQ pain   3. Bloating   4. Irritable bowel syndrome with constipation   5. Nausea without vomiting   6. Gastroesophageal reflux disease, unspecified whether esophagitis present   7. Other dysphagia   8. Anorexia     History of Present Illness This is the patient's first visit to the outpatient [facility] clinic.  The patient has dealt with chronic constipation issues for years.  In the past she had been going multiple days without a bowel movement and eventually had a bowel movement.  In the past she was initiated on Linzess therapy she took it initially at 72 mcg daily without much effectiveness and then was initiated on 145 mcg.  145 mcg causes the patient within 1 to 2 days of taking the medication to have significant amounts of diarrhea as well as bloating and pain.  When she finally passes her bowel movements she experiences rectal discomfort.  The patient also describes abdominal pain that occurs in multiple areas of her abdomen but mostly in her left lower quadrant.  This particular left lower quadrant discomfort began approximately 6 months ago.  It does not resolve with having a bowel movement.  She is experience issues with nausea and occasional vomiting that started approximately a year ago.  At times she will feel a sensation of near dysphagia as well as GERD and she will have regurgitation at times.  She has had a depressed  appetite for a while now.  Thankfully, she has not had significant weight loss but rather weight gain.  She is worried, as she has a history of being a breast cancer survivor, as to whether there could be cancer causing any of her symptoms.  She is willing to move forward with any further evaluation.  It is important to note that she describes many symptoms changing over the course of the last year, as a result of many medications that were added for her mental health.  She has dealt with a lot on a psychosocial perspective and works with behavioral health and psychiatry closely per her report.  She does not take significant nonsteroidals or BC/Goody powders.  She has not had an upper or lower endoscopy previously.  GI Review of Systems Positive as above Negative for early satiety, melena, hematochezia  Review of Systems General: Denies fevers/chills/weight loss unintentionally HEENT: Positive for sore throat in the morning; denies oral lesions Cardiovascular: Denies chest pain/palpitations Pulmonary: Denies shortness of breath Gastroenterological: See HPI Genitourinary: Denies darkened urine or hematuria Hematological: Denies easy bruising/bleeding Endocrine: Denies temperature intolerance Dermatological: Denies jaundice Psychological: Mood is stable currently but she does have some anxiety and actually has a follow-up with psychiatry later today   Medications Current Outpatient Medications  Medication Sig Dispense Refill   cetirizine (ALLERGY, CETIRIZINE,) 10 MG tablet Take 10 mg by mouth daily.     clonazePAM (KLONOPIN) 0.5 MG tablet Take 0.5 mg by mouth daily as needed for anxiety.     gabapentin (NEURONTIN)  300 MG capsule Take 300 mg by mouth 2 (two) times daily.     linaclotide (LINZESS) 145 MCG CAPS capsule Take 1 capsule (145 mcg total) by mouth daily before breakfast. 30 capsule 2   Na Sulfate-K Sulfate-Mg Sulf (SUPREP BOWEL PREP KIT) 17.5-3.13-1.6 GM/177ML SOLN Take 1 kit by mouth  as directed. For colonoscopy prep 354 mL 0   ondansetron (ZOFRAN-ODT) 4 MG disintegrating tablet Take 4 mg by mouth 3 (three) times daily as needed for nausea or vomiting.     pantoprazole (PROTONIX) 40 MG tablet Take 40 mg by mouth 2 (two) times daily.     QUEtiapine (SEROQUEL) 100 MG tablet Take 100 mg by mouth at bedtime.     venlafaxine (EFFEXOR) 50 MG tablet Take 50 mg by mouth daily.     venlafaxine (EFFEXOR) 75 MG tablet Take 75 mg by mouth daily.     No current facility-administered medications for this visit.    Allergies Allergies  Allergen Reactions   Vicodin [Hydrocodone-Acetaminophen] Nausea And Vomiting    Histories Past Medical History:  Diagnosis Date   Anemia    hx of   Anxiety    Breast cancer ([place])    Complication of anesthesia    "awakens combative, scared, and confused"   Depression    GERD (gastroesophageal reflux disease)    Headache(784.0)    Nipple discharge    PONV (postoperative nausea and vomiting)    Seizures ([place])    Past Surgical History:  Procedure Laterality Date   [place], 1992   dental extactions     eight teeth removed upper and lower   MASTECTOMY Bilateral    TUBAL LIGATION  06/04/1991   Social History   Socioeconomic History   Marital status: Divorced    Spouse name: Not on file   Number of children: Not on file   Years of education: Not on file   Highest education level: Not on file  Occupational History   Not on file  Tobacco Use   Smoking status: Former    Packs/day: 1.00    Years: 20.00    Pack years: 20.00    Types: Cigarettes   Smokeless tobacco: Never  Vaping Use   Vaping Use: Some days  Substance and Sexual Activity   Alcohol use: No   Drug use: Yes    Types: Marijuana    Comment: "years ago"   Sexual activity: Yes  Other Topics Concern   Not on file  Social History Narrative   Not on file   Social Determinants of Health   Financial Resource  Strain: Not on file  Food Insecurity: Not on file  Transportation Needs: Not on file  Physical Activity: Not on file  Stress: Not on file  Social Connections: Not on file  Intimate Partner Violence: Not on file   Family History  Problem Relation Age of Onset   Breast cancer Mother 48       died 67 months after diagnosis   Breast cancer Brother        diagnosed under the age of 38   Colon polyps Maternal Aunt    Colon cancer Maternal Aunt    Liver disease Maternal Uncle        x 2; alcohol related   Breast cancer Maternal Grandmother        diagnosed over the age of 58   Esophageal cancer Neg Hx  Stomach cancer Neg Hx    Pancreatic cancer Neg Hx    Inflammatory bowel disease Neg Hx    I have reviewed her medical, social, and family history in detail and updated the electronic medical record as necessary.    PHYSICAL EXAMINATION  BP (!) 108/50   Pulse 68   Ht 5' 1"  (1.549 m)   Wt 133 lb (60.3 kg)   BMI 25.13 kg/m  Wt Readings from Last 3 Encounters:  02/09/21 133 lb (60.3 kg)  05/19/12 116 lb 12.8 oz (53 kg)  03/20/12 117 lb (53.1 kg)  GEN: NAD, appears stated age, doesn't appear chronically ill PSYCH: Cooperative, without pressured speech EYE: Conjunctivae pink, sclerae anicteric ENT: MMM, without oral ulcers CV: RR without R/Gs  RESP: CTAB posteriorly, without wheezing GI: NABS, soft, tenderness to palpation throughout the abdomen upon soft and deep palpation, some slight worsening in the left lower quadrant upon palpation,, without rebound or guarding, unable to appreciate hepatosplenomegaly  GU: DRE deferred as she agrees to a colonoscopy in the coming weeks MSK/EXT: No lower extremity edema SKIN: No jaundice NEURO:  Alert & Oriented x 3, no focal deficits   REVIEW OF DATA  I reviewed the following data at the time of this encounter:  GI Procedures and Studies  No relevant studies to review  Laboratory Studies  Outside laboratories June 2022 WBC  9.0 Hemoglobin/hematocrit 13.6/39.1 MCV 92 Sodium 139 Potassium 3.8 BUN/creatinine 12/0.84 AST/ALT 21/50 Alkaline phosphatase 64 Total bili 0.2 Albumin 4.9 Globulin 2.3 Total protein 7.2 Total cholesterol 317 Triglycerides 206 LDL 226 HDL 50 TSH 2.94 (within normal limits) RPR negative Vitamin D, 25-hydroxy 17.8 ng/mL (low) HIV negative  Imaging Studies  No relevant studies to review   ASSESSMENT  Ms. Lorson is a 50 y.o. female with a pmh significant for prior breast cancer, anxiety, MDD, seizures, seasonal allergies, GERD.  The patient is seen today for evaluation and management of:  1. Chronic constipation   2. Chronic LLQ pain   3. Bloating   4. Irritable bowel syndrome with constipation   5. Nausea without vomiting   6. Gastroesophageal reflux disease, unspecified whether esophagitis present   7. Other dysphagia   8. Anorexia    The patient is hemodynamically stable.  Clinically, she has multiple GI issues and laying.  Although my suspicion is that this will end up being functional in etiology, she has not had a work-up and her discomfort in the left lower quadrant seems a bit out of proportion for which we need to consider diverticulosis/diverticulitis as a possible etiology of symptoms.  We are going to move forward with getting her scheduled for a CT scan, however of her laboratory evaluation today suggests evidence of leukocytosis then I will proceed with likely antibiotics until the CT scan is performed or if she worsens at which point she will need to go to the emergency department for further evaluation.  Going to asked the patient to try to take Linzess on a daily to every other day basis as well as Dulcolax every other day to help with constipation.  If the Linzess is too much for the patient we certainly will try other medications/samples she may use Pepcid or omeprazole as needed for acid reflux, though she does not like to add any medications at this time to the  patient to decide.  Endoscopic evaluation from above and below is reasonable next step as well for her evaluation member to try to get her bowels moving first  to see if we can optimize things.  She will need a 2-day colonoscopy preparation for optimal cleanout.  Time will tell as to how well she does.  The risks and benefits of endoscopic evaluation were discussed with the patient; these include but are not limited to the risk of perforation, infection, bleeding, missed lesions, lack of diagnosis, severe illness requiring hospitalization, as well as anesthesia and sedation related illnesses.  The patient and/or family is agreeable to proceed.  All patient questions were answered to the best of my ability, and the patient agrees to the aforementioned plan of action with follow-up as indicated.   PLAN  Laboratories as outlined below - If difficult leukocytosis is present we will likely proceed with antibiotic treatment for concern of possible diverticulitis Proceed with CT abdomen pelvis with contrast Linzess 145 mcg daily to every other day plus Dulcolax Proceed with scheduling EGD/colonoscopy - To date preparation for colonoscopy May use H2 RA (Pepcid) or PPI (OTC) for GERD based on patient's symptoms Continue current Zofran if needed and may trial other medications if necessary (Phenergan/Compazine)   Orders Placed This Encounter  Procedures   CT Abdomen Pelvis W Contrast   CBC   Comp Met (CMET)   Lipase   Cortisol   Sedimentation rate   C-reactive protein   Tissue transglutaminase, IgA   Hepatitis C Antibody   IgA    New Prescriptions   LINACLOTIDE (LINZESS) 145 MCG CAPS CAPSULE    Take 1 capsule (145 mcg total) by mouth daily before breakfast.   NA SULFATE-K SULFATE-MG SULF (SUPREP BOWEL PREP KIT) 17.5-3.13-1.6 GM/177ML SOLN    Take 1 kit by mouth as directed. For colonoscopy prep   Modified Medications   No medications on file    Planned Follow Up No follow-ups on  file.   Total Time in Face-to-Face and in Coordination of Care for patient including independent/personal interpretation/review of prior testing, medical history, examination, medication adjustment, communicating results with the patient directly, and documentation with the EHR is 45 minutes.   Justice Britain, MD [facility] Advanced Endoscopy Office # [phone]

## 2021-02-12 ENCOUNTER — Encounter: Payer: Self-pay | Admitting: Gastroenterology

## 2021-02-12 DIAGNOSIS — R1032 Left lower quadrant pain: Secondary | ICD-10-CM | POA: Insufficient documentation

## 2021-02-12 DIAGNOSIS — G8929 Other chronic pain: Secondary | ICD-10-CM | POA: Insufficient documentation

## 2021-02-12 DIAGNOSIS — R110 Nausea: Secondary | ICD-10-CM | POA: Insufficient documentation

## 2021-02-12 DIAGNOSIS — R140 Abdominal distension (gaseous): Secondary | ICD-10-CM | POA: Insufficient documentation

## 2021-02-12 DIAGNOSIS — K219 Gastro-esophageal reflux disease without esophagitis: Secondary | ICD-10-CM | POA: Insufficient documentation

## 2021-02-12 DIAGNOSIS — R1319 Other dysphagia: Secondary | ICD-10-CM | POA: Insufficient documentation

## 2021-02-12 DIAGNOSIS — K5909 Other constipation: Secondary | ICD-10-CM | POA: Insufficient documentation

## 2021-02-12 DIAGNOSIS — K581 Irritable bowel syndrome with constipation: Secondary | ICD-10-CM | POA: Insufficient documentation

## 2021-02-12 DIAGNOSIS — R630 Anorexia: Secondary | ICD-10-CM | POA: Insufficient documentation

## 2021-02-12 LAB — HEPATITIS C ANTIBODY
Hepatitis C Ab: NONREACTIVE
SIGNAL TO CUT-OFF: 0.01 (ref <1.00)

## 2021-02-12 LAB — IGA: Immunoglobulin A: 161 mg/dL (ref 47–310)

## 2021-02-12 LAB — TISSUE TRANSGLUTAMINASE, IGA: (tTG) Ab, IgA: <1 U/mL

## 2021-02-16 ENCOUNTER — Encounter (HOSPITAL_COMMUNITY): Payer: Self-pay

## 2021-02-16 ENCOUNTER — Ambulatory Visit (HOSPITAL_COMMUNITY)
Admission: RE | Admit: 2021-02-16 | Discharge: 2021-02-16 | Disposition: A | Discharge status: Home / Self Care | Payer: Medicaid Other | Source: Ambulatory Visit | Attending: Gastroenterology | Admitting: Gastroenterology

## 2021-02-16 ENCOUNTER — Other Ambulatory Visit: Payer: Self-pay

## 2021-02-16 DIAGNOSIS — R1032 Left lower quadrant pain: Secondary | ICD-10-CM | POA: Insufficient documentation

## 2021-02-16 DIAGNOSIS — K219 Gastro-esophageal reflux disease without esophagitis: Secondary | ICD-10-CM

## 2021-02-16 DIAGNOSIS — K581 Irritable bowel syndrome with constipation: Secondary | ICD-10-CM | POA: Diagnosis present

## 2021-02-16 DIAGNOSIS — G8929 Other chronic pain: Secondary | ICD-10-CM

## 2021-02-16 DIAGNOSIS — K5909 Other constipation: Secondary | ICD-10-CM

## 2021-02-16 DIAGNOSIS — R110 Nausea: Secondary | ICD-10-CM

## 2021-02-16 DIAGNOSIS — R140 Abdominal distension (gaseous): Secondary | ICD-10-CM

## 2021-02-16 MED ORDER — IOHEXOL 350 MG/ML SOLN
80.0000 mL | Freq: Once | INTRAVENOUS | Status: AC | PRN
  Administered 2021-02-16: 80 mL via INTRAVENOUS

## 2021-02-20 ENCOUNTER — Encounter: Payer: Self-pay | Admitting: Gastroenterology

## 2021-02-20 ENCOUNTER — Other Ambulatory Visit: Payer: Self-pay

## 2021-02-20 ENCOUNTER — Ambulatory Visit (AMBULATORY_SURGERY_CENTER): Payer: Medicaid Other | Admitting: Gastroenterology

## 2021-02-20 VITALS — BP 127/60 | HR 59 | Temp 98.0°F | Resp 11 | Ht 61.0 in | Wt 133.0 lb

## 2021-02-20 DIAGNOSIS — K319 Disease of stomach and duodenum, unspecified: Secondary | ICD-10-CM

## 2021-02-20 DIAGNOSIS — K2970 Gastritis, unspecified, without bleeding: Secondary | ICD-10-CM

## 2021-02-20 DIAGNOSIS — K635 Polyp of colon: Secondary | ICD-10-CM | POA: Diagnosis not present

## 2021-02-20 DIAGNOSIS — G8929 Other chronic pain: Secondary | ICD-10-CM

## 2021-02-20 DIAGNOSIS — R1319 Other dysphagia: Secondary | ICD-10-CM

## 2021-02-20 DIAGNOSIS — R110 Nausea: Secondary | ICD-10-CM

## 2021-02-20 DIAGNOSIS — K581 Irritable bowel syndrome with constipation: Secondary | ICD-10-CM

## 2021-02-20 DIAGNOSIS — K219 Gastro-esophageal reflux disease without esophagitis: Secondary | ICD-10-CM

## 2021-02-20 DIAGNOSIS — K5909 Other constipation: Secondary | ICD-10-CM | POA: Diagnosis not present

## 2021-02-20 DIAGNOSIS — R140 Abdominal distension (gaseous): Secondary | ICD-10-CM | POA: Diagnosis not present

## 2021-02-20 DIAGNOSIS — D124 Benign neoplasm of descending colon: Secondary | ICD-10-CM

## 2021-02-20 DIAGNOSIS — R1032 Left lower quadrant pain: Secondary | ICD-10-CM | POA: Diagnosis not present

## 2021-02-20 MED ORDER — SODIUM CHLORIDE 0.9 % IV SOLN: 500.0000 mL | Freq: Once | INTRAVENOUS | Status: DC

## 2021-02-20 MED ORDER — PANTOPRAZOLE SODIUM 40 MG PO TBEC: 40.0000 mg | DELAYED_RELEASE_TABLET | Freq: Two times a day (BID) | ORAL | 3 refills | Status: DC

## 2021-02-20 NOTE — Progress Notes (Signed)
GASTROENTEROLOGY PROCEDURE H&P NOTE   Primary Care Physician: [facility]  HPI: Kerry Travis is a 50 y.o. female who presents for EGD/Colonoscopy for abdominal pain/bloating/chronic constipation/GERD/Dysphagia.  Past Medical History:  Diagnosis Date   Anemia    hx of   Anxiety    Breast cancer ([place])    Complication of anesthesia    "awakens combative, scared, and confused"   Depression    GERD (gastroesophageal reflux disease)    Headache(784.0)    Nipple discharge    PONV (postoperative nausea and vomiting)    Seizures ([place])    Past Surgical History:  Procedure Laterality Date   [place]     dental extactions     eight teeth removed upper and lower   MASTECTOMY Bilateral    TUBAL LIGATION  06/04/1991   UPPER GASTROINTESTINAL ENDOSCOPY     Current Outpatient Medications  Medication Sig Dispense Refill   cetirizine (ZYRTEC) 10 MG tablet Take 10 mg by mouth daily.     clonazePAM (KLONOPIN) 0.5 MG tablet Take 0.5 mg by mouth daily as needed for anxiety.     gabapentin (NEURONTIN) 300 MG capsule Take 300 mg by mouth 2 (two) times daily.     ondansetron (ZOFRAN-ODT) 4 MG disintegrating tablet Take 4 mg by mouth 3 (three) times daily as needed for nausea or vomiting.     propranolol (INDERAL) 40 MG tablet Take 40 mg by mouth 2 (two) times daily.     QUEtiapine (SEROQUEL) 100 MG tablet Take 100 mg by mouth at bedtime.     venlafaxine (EFFEXOR) 50 MG tablet Take 50 mg by mouth daily.     venlafaxine (EFFEXOR) 75 MG tablet Take 75 mg by mouth daily.     atorvastatin (LIPITOR) 10 MG tablet Take 10 mg by mouth daily. (Patient not taking: Reported on 02/20/2021)     fluticasone (FLONASE) 50 MCG/ACT nasal spray Place into both nostrils.     latanoprost (XALATAN) 0.005 % ophthalmic solution 1 drop at bedtime.     linaclotide (LINZESS) 145 MCG CAPS capsule Take 1 capsule (145 mcg  total) by mouth daily before breakfast. (Patient not taking: Reported on 02/20/2021) 30 capsule 2   pantoprazole (PROTONIX) 40 MG tablet Take 40 mg by mouth 2 (two) times daily. (Patient not taking: Reported on 02/20/2021)     Current Facility-Administered Medications  Medication Dose Route Frequency Provider Last Rate Last Admin   0.9 %  sodium chloride infusion  500 mL Intravenous Once Mansouraty, Telford Nab., MD        Current Outpatient Medications:    cetirizine (ZYRTEC) 10 MG tablet, Take 10 mg by mouth daily., Disp: , Rfl:    clonazePAM (KLONOPIN) 0.5 MG tablet, Take 0.5 mg by mouth daily as needed for anxiety., Disp: , Rfl:    gabapentin (NEURONTIN) 300 MG capsule, Take 300 mg by mouth 2 (two) times daily., Disp: , Rfl:    ondansetron (ZOFRAN-ODT) 4 MG disintegrating tablet, Take 4 mg by mouth 3 (three) times daily as needed for nausea or vomiting., Disp: , Rfl:    propranolol (INDERAL) 40 MG tablet, Take 40 mg by mouth 2 (two) times daily., Disp: , Rfl:    QUEtiapine (SEROQUEL) 100 MG tablet, Take 100 mg by mouth at bedtime., Disp: , Rfl:    venlafaxine (EFFEXOR) 50 MG tablet, Take 50 mg by mouth daily., Disp: ,  Rfl:    venlafaxine (EFFEXOR) 75 MG tablet, Take 75 mg by mouth daily., Disp: , Rfl:    atorvastatin (LIPITOR) 10 MG tablet, Take 10 mg by mouth daily. (Patient not taking: Reported on 02/20/2021), Disp: , Rfl:    fluticasone (FLONASE) 50 MCG/ACT nasal spray, Place into both nostrils., Disp: , Rfl:    latanoprost (XALATAN) 0.005 % ophthalmic solution, 1 drop at bedtime., Disp: , Rfl:    linaclotide (LINZESS) 145 MCG CAPS capsule, Take 1 capsule (145 mcg total) by mouth daily before breakfast. (Patient not taking: Reported on 02/20/2021), Disp: 30 capsule, Rfl: 2   pantoprazole (PROTONIX) 40 MG tablet, Take 40 mg by mouth 2 (two) times daily. (Patient not taking: Reported on 02/20/2021), Disp: , Rfl:   Current Facility-Administered Medications:    0.9 %  sodium chloride infusion,  500 mL, Intravenous, Once, Mansouraty, Telford Nab., MD Allergies  Allergen Reactions   Codeine    Vicodin [Hydrocodone-Acetaminophen] Nausea And Vomiting   Family History  Problem Relation Age of Onset   Breast cancer Mother 76       died 54 months after diagnosis   Breast cancer Brother        diagnosed under the age of 71   Colon polyps Maternal Aunt    Colon cancer Maternal Aunt    Liver disease Maternal Uncle        x 2; alcohol related   Breast cancer Maternal Grandmother        diagnosed over the age of 56   Esophageal cancer Neg Hx    Stomach cancer Neg Hx    Pancreatic cancer Neg Hx    Inflammatory bowel disease Neg Hx    Social History   Socioeconomic History   Marital status: Divorced    Spouse name: Not on file   Number of children: Not on file   Years of education: Not on file   Highest education level: Not on file  Occupational History   Not on file  Tobacco Use   Smoking status: Former    Packs/day: 1.00    Years: 20.00    Pack years: 20.00    Types: Cigarettes    Quit date: 09/06/2020    Years since quitting: 0.4   Smokeless tobacco: Never  Vaping Use   Vaping Use: Some days   Substances: Nicotine, Flavoring  Substance and Sexual Activity   Alcohol use: No   Drug use: Yes    Types: Marijuana    Comment: last used sunday 02/18/21   Sexual activity: Yes    Birth control/protection: Surgical  Other Topics Concern   Not on file  Social History Narrative   Not on file   Social Determinants of Health   Financial Resource Strain: Not on file  Food Insecurity: Not on file  Transportation Needs: Not on file  Physical Activity: Not on file  Stress: Not on file  Social Connections: Not on file  Intimate Partner Violence: Not on file    Physical Exam: Today's Vitals   02/20/21 1312  BP: (!) 96/56  Pulse: 60  Temp: 98 F (36.7 C)  TempSrc: Temporal  SpO2: 100%  Weight: 133 lb (60.3 kg)  Height: 5\' 1"  (1.549 m)  PainSc: 8    Body mass  index is 25.13 kg/m. GEN: NAD EYE: Sclerae anicteric ENT: MMM CV: Non-tachycardic GI: Soft, NT/ND NEURO:  Alert & Oriented x 3  Lab Results: No results for input(s): WBC, HGB, HCT, PLT in the last  72 hours. BMET No results for input(s): NA, K, CL, CO2, GLUCOSE, BUN, CREATININE, CALCIUM in the last 72 hours. LFT No results for input(s): PROT, ALBUMIN, AST, ALT, ALKPHOS, BILITOT, BILIDIR, IBILI in the last 72 hours. PT/INR No results for input(s): LABPROT, INR in the last 72 hours.   Impression / Plan: This is a 50 y.o.female who presents for EGD/Colonoscopy for abdominal pain/bloating/chronic constipation/GERD/Dysphagia.  The risks and benefits of endoscopic evaluation/treatment were discussed with the patient and/or family; these include but are not limited to the risk of perforation, infection, bleeding, missed lesions, lack of diagnosis, severe illness requiring hospitalization, as well as anesthesia and sedation related illnesses.  The patient's history has been reviewed, patient examined, no change in status, and deemed stable for procedure.  The patient and/or family is agreeable to proceed.    Justice Britain, MD [facility] Advanced Endoscopy Office # [phone]

## 2021-02-20 NOTE — Progress Notes (Signed)
1350 Robinul 0.1 mg IV given due large amount of secretions upon assessment.  MD made aware, vss

## 2021-02-20 NOTE — Progress Notes (Signed)
Pt's states no medical or surgical changes since previsit or office visit.  Vitals DT 

## 2021-02-20 NOTE — Progress Notes (Signed)
Called to room to assist during endoscopic procedure.  Patient ID and intended procedure confirmed with present staff. Received instructions for my participation in the procedure from the performing physician.  

## 2021-02-20 NOTE — Op Note (Signed)
[place] Patient Name: Kerry Travis Procedure Date: 02/20/2021 1:41 PM MRN: [id number] Endoscopist: Justice Britain , MD Age: 50 Referring MD:  Date of Birth: Mar 29, 1971 Gender: Female Account #: [id number] Procedure:                Upper GI endoscopy Indications:              Generalized abdominal pain, Dysphagia, Heartburn,                            Abdominal bloating Medicines:                Monitored Anesthesia Care Procedure:                Pre-Anesthesia Assessment:                           - Prior to the procedure, a History and Physical                            was performed, and patient medications and                            allergies were reviewed. The patient's tolerance of                            previous anesthesia was also reviewed. The risks                            and benefits of the procedure and the sedation                            options and risks were discussed with the patient.                            All questions were answered, and informed consent                            was obtained. Prior Anticoagulants: The patient has                            taken no previous anticoagulant or antiplatelet                            agents. ASA Grade Assessment: III - A patient with                            severe systemic disease. After reviewing the risks                            and benefits, the patient was deemed in                            satisfactory condition to undergo the procedure.  After obtaining informed consent, the endoscope was                            passed under direct vision. Throughout the                            procedure, the patient's blood pressure, pulse, and                            oxygen saturations were monitored continuously. The                            Endoscope was introduced through the mouth, and                            advanced to the second part of  duodenum. The upper                            GI endoscopy was accomplished without difficulty.                            The patient tolerated the procedure. Scope In: Scope Out: Findings:                 No gross lesions were noted in the proximal                            esophagus and in the mid esophagus.                           Two tongues of salmon-colored mucosa were present                            from 35 to 36 cm. No other visible abnormalities                            were present. Biopsies were taken with a cold                            forceps for histology.                           The Z-line was irregular and was found 36 cm from                            the incisors.                           Biopsies were taken with a cold forceps in the                            entire esophagus for histology to rule out EoE/LoE.  After the rest of the EGD was complete, a guidewire                            was placed and the scope was withdrawn. Dilation                            was performed in the esophagus with a Savary                            dilator with mild resistance at 18 mm. The dilation                            site was examined following endoscope reinsertion                            and showed no change.                           A 4 cm hiatal hernia was present.                           Segmental moderate inflammation characterized by                            erosions, erythema and granularity was found in the                            gastric body and in the gastric antrum.                           No other gross lesions were noted in the entire                            examined stomach. Biopsies were taken with a cold                            forceps for histology and Helicobacter pylori                            testing.                           No gross lesions were noted in the duodenal bulb,                             in the first portion of the duodenum and in the                            second portion of the duodenum. Biopsies for                            histology were taken with a cold forceps for  evaluation of celiac disease. Complications:            No immediate complications. Estimated Blood Loss:     Estimated blood loss was minimal. Impression:               - No gross lesions in esophagus proximally.                            Salmon-colored mucosa suggestive of short-segment                            Barrett's esophagus distally - biopsied.                           - Irregular, 36 cm from the incisors.                           - Dilation performed in the esophagus.                           - 4 cm hiatal hernia.                           - Gastritis in body/antrum. No other gross lesions                            in the stomach. Biopsied.                           - No gross lesions in the duodenal bulb, in the                            first portion of the duodenum and in the second                            portion of the duodenum. Biopsied. Recommendation:           - Proceed to scheduled colonoscopy.                           - Observe patient's clinical course.                           - Recommend taking Protonix 40 mg twice daily.                           - Consider Carafate BID.                           - Await pathology results.                           - The findings and recommendations were discussed                            with the patient.                           -  The findings and recommendations were discussed                            with the designated responsible adult. Justice Britain, MD 02/20/2021 2:45:11 PM

## 2021-02-20 NOTE — Op Note (Signed)
[place] Patient Name: Kerry Travis Procedure Date: 02/20/2021 1:39 PM MRN: [id number] Endoscopist: Justice Britain , MD Age: 50 Referring MD:  Date of Birth: Oct 07, 1970 Gender: Female Account #: [id number] Procedure:                Colonoscopy Indications:              Screening for colorectal malignant neoplasm,                            Incidental - Generalized abdominal pain, Incidental                            constipation noted Medicines:                Monitored Anesthesia Care Procedure:                Pre-Anesthesia Assessment:                           - Prior to the procedure, a History and Physical                            was performed, and patient medications and                            allergies were reviewed. The patient's tolerance of                            previous anesthesia was also reviewed. The risks                            and benefits of the procedure and the sedation                            options and risks were discussed with the patient.                            All questions were answered, and informed consent                            was obtained. Prior Anticoagulants: The patient has                            taken no previous anticoagulant or antiplatelet                            agents. ASA Grade Assessment: III - A patient with                            severe systemic disease. After reviewing the risks                            and benefits, the patient was deemed in  satisfactory condition to undergo the procedure.                           After obtaining informed consent, the colonoscope                            was passed under direct vision. Throughout the                            procedure, the patient's blood pressure, pulse, and                            oxygen saturations were monitored continuously. The                            Olympus PCF-H190DL (#[id number])  Colonoscope was                            introduced through the anus and advanced to the 5                            cm into the ileum. The colonoscopy was unusually                            difficult due to significant looping. Successful                            completion of the procedure was aided by changing                            the patient to a supine position, changing the                            patient to a prone position, using manual pressure,                            withdrawing and reinserting the scope,                            straightening and shortening the scope to obtain                            bowel loop reduction and using scope torsion. The                            patient tolerated the procedure. The quality of the                            bowel preparation was good. The terminal ileum,                            ileocecal valve, appendiceal orifice, and rectum  were photographed. Scope In: 2:13:53 PM Scope Out: 2:35:22 PM Scope Withdrawal Time: 0 hours 12 minutes 2 seconds  Total Procedure Duration: 0 hours 21 minutes 29 seconds  Findings:                 The digital rectal exam findings include                            hemorrhoids. Pertinent negatives include no                            palpable rectal lesions.                           The terminal ileum and ileocecal valve appeared                            normal.                           A 5 mm polyp was found in the descending colon. The                            polyp was sessile. The polyp was removed with a                            cold snare. Resection and retrieval were complete.                           Normal mucosa was found in the entire colon                            otherwise.                           Non-bleeding non-thrombosed external and internal                            hemorrhoids were found during retroflexion, during                             perianal exam and during digital exam. The                            hemorrhoids were Grade II (internal hemorrhoids                            that prolapse but reduce spontaneously). Complications:            No immediate complications. Estimated Blood Loss:     Estimated blood loss was minimal. Impression:               - Hemorrhoids found on digital rectal exam.                           - The examined portion of the ileum was normal.                           -  One 5 mm polyp in the descending colon, removed                            with a cold snare. Resected and retrieved.                           - Normal mucosa in the entire examined colon                            otherwise.                           - Non-bleeding non-thrombosed external and internal                            hemorrhoids. Recommendation:           - The patient will be observed post-procedure,                            until all discharge criteria are met.                           - Discharge patient to home.                           - Patient has a contact number available for                            emergencies. The signs and symptoms of potential                            delayed complications were discussed with the                            patient. Return to normal activities tomorrow.                            Written discharge instructions were provided to the                            patient.                           - High fiber diet.                           - Use FiberCon 1-2 tablets PO daily.                           - Continue present medications.                           - Continue Linzess for now. Will consider Amitiza                            or Trulance or  Motegrity in future.                           - Await pathology results.                           - Repeat colonoscopy in 10/07/08 years for                            surveillance based on  pathology results and                            findings of adenomatous tissue.                           - The findings and recommendations were discussed                            with the patient.                           - The findings and recommendations were discussed                            with the designated responsible adult. Justice Britain, MD 02/20/2021 2:51:22 PM

## 2021-02-20 NOTE — Progress Notes (Signed)
Report given to PACU, vss 

## 2021-02-20 NOTE — Patient Instructions (Signed)
Handout given:  gastritis, polyps, post dilation diet, hiatal hernia, high fiber diet Start taking protonix 40mg  twice daily Start high fiber diet Use fibercone 1-2 tablets daily Continue current medications Await pathology results  YOU HAD AN ENDOSCOPIC PROCEDURE TODAY AT [facility]:   Refer to the procedure report that was given to you for any specific questions about what was found during the examination.  If the procedure report does not answer your questions, please call your gastroenterologist to clarify.  If you requested that your care partner not be given the details of your procedure findings, then the procedure report has been included in a sealed envelope for you to review at your convenience later.  YOU SHOULD EXPECT: Some feelings of bloating in the abdomen. Passage of more gas than usual.  Walking can help get rid of the air that was put into your GI tract during the procedure and reduce the bloating. If you had a lower endoscopy (such as a colonoscopy or flexible sigmoidoscopy) you may notice spotting of blood in your stool or on the toilet paper. If you underwent a bowel prep for your procedure, you may not have a normal bowel movement for a few days.  Please Note:  You might notice some irritation and congestion in your nose or some drainage.  This is from the oxygen used during your procedure.  There is no need for concern and it should clear up in a day or so.  SYMPTOMS TO REPORT IMMEDIATELY:  Following lower endoscopy (colonoscopy or flexible sigmoidoscopy):  Excessive amounts of blood in the stool  Significant tenderness or worsening of abdominal pains  Swelling of the abdomen that is new, acute  Fever of 100F or higher  Following upper endoscopy (EGD)  Vomiting of blood or coffee ground material  New chest pain or pain under the shoulder blades  Painful or persistently difficult swallowing  New shortness of breath  Fever of 100F or higher  Black,  tarry-looking stools  For urgent or emergent issues, a gastroenterologist can be reached at any hour by calling [phone]. Do not use MyChart messaging for urgent concerns.   DIET:  We do recommend a small meal at first, but then you may proceed to your regular diet.  Drink plenty of fluids but you should avoid alcoholic beverages for 24 hours.  ACTIVITY:  You should plan to take it easy for the rest of today and you should NOT DRIVE or use heavy machinery until tomorrow (because of the sedation medicines used during the test).    FOLLOW UP: Our staff will call the number listed on your records 48-72 hours following your procedure to check on you and address any questions or concerns that you may have regarding the information given to you following your procedure. If we do not reach you, we will leave a message.  We will attempt to reach you two times.  During this call, we will ask if you have developed any symptoms of COVID 19. If you develop any symptoms (ie: fever, flu-like symptoms, shortness of breath, cough etc.) before then, please call [phone].  If you test positive for Covid 19 in the 2 weeks post procedure, please call and report this information to [place].    If any biopsies were taken you will be contacted by phone or by letter within the next 1-3 weeks.  Please call us at [phone] if you have not heard about the biopsies in 3 weeks.  SIGNATURES/CONFIDENTIALITY: You and/or your care partner have signed paperwork which will be entered into your electronic medical record.  These signatures attest to the fact that that the information above on your After Visit Summary has been reviewed and is understood.  Full responsibility of the confidentiality of this discharge information lies with you and/or your care-partner.

## 2021-02-22 ENCOUNTER — Telehealth: Payer: Self-pay

## 2021-02-22 NOTE — Telephone Encounter (Signed)
  Follow up Call-  Call back number 02/20/2021  Post procedure Call Back phone  # [phone]  Permission to leave phone message Yes  Some recent data might be hidden     Patient questions:  Do you have a fever, pain , or abdominal swelling? No. Pain Score  0 *  Have you tolerated food without any problems? Yes.    Have you been able to return to your normal activities? Yes.    Do you have any questions about your discharge instructions: Diet   No. Medications  No. Follow up visit  No.  Do you have questions or concerns about your Care? No.  Actions: * If pain score is 4 or above: No action needed, pain <4.  Have you developed a fever since your procedure? no  2.   Have you had an respiratory symptoms (SOB or cough) since your procedure? no  3.   Have you tested positive for COVID 19 since your procedure no  4.   Have you had any family members/close contacts diagnosed with the COVID 19 since your procedure?  no   If yes to any of these questions please route to Joylene John, RN and Joella Prince, RN

## 2021-02-26 ENCOUNTER — Encounter: Payer: Self-pay | Admitting: Gastroenterology

## 2021-03-02 ENCOUNTER — Telehealth: Payer: Self-pay | Admitting: Gastroenterology

## 2021-03-02 NOTE — Telephone Encounter (Signed)
Left message on machine to call back  

## 2021-03-02 NOTE — Telephone Encounter (Signed)
Patty, Please let the patient know that we certainly believe her discomfort. The symptom of worsened pain when she performs sit ups or movement make the possibility of an underlying musculoskeletal reason for pain possible but I do think her constipation is still an issue. She can increase her Linzess to 290 mcg daily. If she tries this for another week and is still having issues then we should consider Trulance or Motegrity or Amitiza transition to see if she may get some benefit from that. Let set her up a follow-up with one of the APP's or myself in the next few weeks. She should give [place] a call next Thursday or the week after to let us know where things stand in regards to her bowel movements and potential need for transition of medication regimen. Thanks. GM

## 2021-03-02 NOTE — Telephone Encounter (Signed)
I have called the pt and given her the information contained in the path letters.  I have advised her that the letters are in My Chart and mailed to her home.    She wants to let Dr Rush Landmark know that she continues to have LLQ pain that causes her to have such severe pain that she cannot breathe.  She says that sit ups cause this reaction most often. She also continues to have constipation despite 7 capfuls of miralax yesterday and today and linzess 145 mcg daily.  She is concerned that no one believes her and thinks she is lying about the pain.  Please advise any further recommendations.

## 2021-03-05 NOTE — Telephone Encounter (Signed)
Left message on machine to call back  

## 2021-03-06 NOTE — Telephone Encounter (Signed)
The pt returned call and states that she will try linzess 290 mcg but she is not sure why because Dr Rush Landmark told her that there is "nothing wrong with her."  She agrees to appt with Dr Rush Landmark on 11/9.  I offered an appt for 10/7 but she declined stating her jeep is in the shop.  She was very tearful on the phone and would not let this nurse explain that she needs to call in a few days to report on her symptoms due to speaking over me saying but I was told "there is nothing wrong with me, so why change the medication."  After several attempts to discuss and explain the pt states (while crying) I will be there and hung up.

## 2021-03-06 NOTE — Telephone Encounter (Signed)
Thank you for update Kerry Travis and trying to talk with her. GM

## 2021-03-09 ENCOUNTER — Ambulatory Visit: Payer: Medicaid Other | Admitting: Gastroenterology

## 2021-04-11 ENCOUNTER — Encounter: Payer: Self-pay | Admitting: Gastroenterology

## 2021-04-11 ENCOUNTER — Other Ambulatory Visit: Payer: Medicaid Other

## 2021-04-11 ENCOUNTER — Ambulatory Visit (INDEPENDENT_AMBULATORY_CARE_PROVIDER_SITE_OTHER): Payer: Medicaid Other | Admitting: Gastroenterology

## 2021-04-11 VITALS — BP 108/64 | HR 65 | Ht 62.0 in | Wt 137.0 lb

## 2021-04-11 DIAGNOSIS — K219 Gastro-esophageal reflux disease without esophagitis: Secondary | ICD-10-CM | POA: Diagnosis not present

## 2021-04-11 DIAGNOSIS — K449 Diaphragmatic hernia without obstruction or gangrene: Secondary | ICD-10-CM | POA: Diagnosis not present

## 2021-04-11 DIAGNOSIS — R140 Abdominal distension (gaseous): Secondary | ICD-10-CM | POA: Diagnosis not present

## 2021-04-11 DIAGNOSIS — R1032 Left lower quadrant pain: Secondary | ICD-10-CM

## 2021-04-11 DIAGNOSIS — K589 Irritable bowel syndrome without diarrhea: Secondary | ICD-10-CM | POA: Diagnosis not present

## 2021-04-11 MED ORDER — ESOMEPRAZOLE MAGNESIUM 40 MG PO CPDR: 40.0000 mg | DELAYED_RELEASE_CAPSULE | Freq: Every day | ORAL | 2 refills | Status: DC

## 2021-04-11 MED ORDER — RIFAXIMIN 550 MG PO TABS: 550.0000 mg | ORAL_TABLET | Freq: Two times a day (BID) | ORAL | 0 refills | Status: AC

## 2021-04-11 NOTE — Progress Notes (Addendum)
[place] VISIT   Primary Care Provider [facility] [place] [address] [phone]   Patient Profile: Kerry Travis is a 50 y.o. 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  The patient presents to the [facility] for an evaluation and management of problem(s) noted below:  Problem List 1. Gastroesophageal reflux disease, unspecified whether esophagitis present   2. Bloating   3. Irritable bowel syndrome, unspecified type   4. Hiatal hernia   5. LLQ pain      History of Present Illness Please see prior progress notes for full details of HPI.  Interval History Today, the patient return reviewed those in epic s for a scheduled follow-up.  She has undergone an upper and lower endoscopy in September.  She was found to have reactive gastropathy that was negative for H. pylori as well as some esophageal microscopic acid reflux changes with no evidence of eosinophilic esophagitis and evidence of celiac disease.  A single polyp was found in the colon and that returned as hyperplastic.  The patient states that she has been gaining weight without trying.  She describes some persistent issues with abdominal bloating pain with associated nausea and vomiting.  However, when she was taking Linzess 290 mcg daily and then 145 mcg after having bowel movements and began experiencing significant passage of her bowels, she had significant improvement in her pain and bloating.  She still has daily left lower quadrant discomfort that occurs at any time point and is exacerbated at times by certain rotational movements.  She is not noted any blood in her stools.  The patient is dealing with increased stress and anxiety.  She is worried that she will be losing her significant other as a  result of increased weight that she has been experiencing.  She will think about her son who passed away last year.  She does not feel that her GERD symptoms are well controlled on current PPI but thinks that Nexium in the past that helped.  No dysphagia symptoms currently.  From a psychosocial perspective she is still working with her psychiatrist closely.  GI Review of Systems Positive as above Negative for early satiety, melena, hematochezia  Review of Systems General: Denies fevers/chills/weight loss unintentionally Cardiovascular: Denies chest pain Pulmonary: Denies shortness of breath Gastroenterological: See HPI Genitourinary: Denies darkened urine Hematological: Denies easy bruising/bleeding Dermatological: Denies jaundice Psychological: Mood is stable but she continues to deal with anxiety and stress discussing things further with her psychiatrist   Medications Current Outpatient Medications  Medication Sig Dispense Refill   atorvastatin (LIPITOR) 10 MG tablet Take 10 mg by mouth daily.     cetirizine (ZYRTEC) 10 MG tablet Take 10 mg by mouth daily.     clonazePAM (KLONOPIN) 0.5 MG tablet Take 0.5 mg by mouth daily as needed for anxiety.     esomeprazole (NEXIUM) 40 MG capsule Take 1 capsule (40 mg total) by mouth daily before breakfast. 30 capsule 2   fluticasone (FLONASE) 50 MCG/ACT nasal spray Place into both nostrils.     gabapentin (NEURONTIN) 300 MG capsule Take 300 mg by mouth 2 (two) times daily.     latanoprost (XALATAN) 0.005 % ophthalmic solution 1 drop at bedtime.     linaclotide (LINZESS) 145 MCG CAPS capsule Take 1 capsule (145 mcg total) by mouth daily before breakfast. 30 capsule 2  ondansetron (ZOFRAN-ODT) 4 MG disintegrating tablet Take 4 mg by mouth 3 (three) times daily as needed for nausea or vomiting.     propranolol (INDERAL) 40 MG tablet Take 40 mg by mouth 2 (two) times daily.     QUEtiapine (SEROQUEL) 100 MG tablet Take 100 mg by mouth at bedtime.      rifaximin (XIFAXAN) 550 MG TABS tablet Take 1 tablet (550 mg total) by mouth 2 (two) times daily. 20 tablet 0   venlafaxine (EFFEXOR) 75 MG tablet Take 75 mg by mouth daily.     venlafaxine XR (EFFEXOR-XR) 150 MG 24 hr capsule Take 150 mg by mouth daily with breakfast.     No current facility-administered medications for this visit.    Allergies Allergies  Allergen Reactions   Codeine    Vicodin [Hydrocodone-Acetaminophen] Nausea And Vomiting    Histories Past Medical History:  Diagnosis Date   Anemia    hx of   Anxiety    Breast cancer ([place])    Complication of anesthesia    "awakens combative, scared, and confused"   Depression    GERD (gastroesophageal reflux disease)    Headache(784.0)    Nipple discharge    PONV (postoperative nausea and vomiting)    Seizures ([place])    Past Surgical History:  Procedure Laterality Date   ABDOMINAL HYSTERECTOMY     CESAREAN SECTION     1989, 1990, 1992   COLONOSCOPY     dental extactions     eight teeth removed upper and lower   MASTECTOMY Bilateral    TUBAL LIGATION  06/04/1991   UPPER GASTROINTESTINAL ENDOSCOPY     Social History   Socioeconomic History   Marital status: Divorced    Spouse name: Not on file   Number of children: Not on file   Years of education: Not on file   Highest education level: Not on file  Occupational History   Not on file  Tobacco Use   Smoking status: Former    Packs/day: 1.00    Years: 20.00    Pack years: 20.00    Types: Cigarettes    Quit date: 09/06/2020    Years since quitting: 0.5   Smokeless tobacco: Never  Vaping Use   Vaping Use: Some days   Substances: Nicotine, Flavoring  Substance and Sexual Activity   Alcohol use: No   Drug use: Yes    Types: Marijuana    Comment: last used sunday 02/18/21   Sexual activity: Yes    Birth control/protection: Surgical  Other Topics Concern   Not on file  Social History Narrative   Not on file   Social Determinants of Health    Financial Resource Strain: Not on file  Food Insecurity: Not on file  Transportation Needs: Not on file  Physical Activity: Not on file  Stress: Not on file  Social Connections: Not on file  Intimate Partner Violence: Not on file   Family History  Problem Relation Age of Onset   Breast cancer Mother 78       died 34 months after diagnosis   Breast cancer Brother        diagnosed under the age of 58   Colon polyps Maternal Aunt    Colon cancer Maternal Aunt    Liver disease Maternal Uncle        x 2; alcohol related   Breast cancer Maternal Grandmother        diagnosed over the age of 2  Esophageal cancer Neg Hx    Stomach cancer Neg Hx    Pancreatic cancer Neg Hx    Inflammatory bowel disease Neg Hx    I have reviewed her medical, social, and family history in detail and updated the electronic medical record as necessary.    PHYSICAL EXAMINATION  BP 108/64   Pulse 65   Ht 5\' 2"  (1.575 m)   Wt 137 lb (62.1 kg)   LMP 05/03/2012   BMI 25.06 kg/m  Wt Readings from Last 3 Encounters:  04/11/21 137 lb (62.1 kg)  02/20/21 133 lb (60.3 kg)  02/09/21 133 lb (60.3 kg)  GEN: NAD, appears stated age, doesn't appear chronically ill PSYCH: Cooperative, without pressured speech EYE: Conjunctivae pink, sclerae anicteric ENT: MMM, poor dentition noted CV: Nontachycardic RESP: No audible wheezing GI: NABS, soft, TTP to LLQ and exacerbated straining abdominal muscles, no rebound or guarding MSK/EXT: No lower extremity edema SKIN: No jaundice NEURO:  Alert & Oriented x 3, no focal deficits   REVIEW OF DATA  I reviewed the following data at the time of this encounter:  GI Procedures and Studies  September 2022 EGD - No gross lesions in esophagus proximally. Salmon-colored mucosa suggestive of short segment Barrett's esophagus distally - biopsied. - Irregular, 36 cm from the incisors. - Dilation performed in the esophagus. - 4 cm hiatal hernia. - Gastritis in body/antrum.  No other gross lesions in the stomach. Biopsied. - No gross lesions in the duodenal bulb, in the first portion of the duodenum and in the second portion of the duodenum. Biopsied.  September 2022 colonoscopy - Hemorrhoids found on digital rectal exam. - The examined portion of the ileum was normal. - One 5 mm polyp in the descending colon, removed with a cold snare. Resected and retrieved. - Normal mucosa in the entire examined colon otherwise. - Non-bleeding non-thrombosed external and internal hemorrhoids.  Pathology Diagnosis 1. Surgical [P], random duodenal sites - BENIGN SMALL BOWEL MUCOSA. - NO VILLOUS BLUNTING OR INCREASE IN INTRAEPITHELIAL LYMPHOCYTES. - NO DYSPLASIA OR MALIGNANCY. 2. Surgical [P], gastric antrum and gastric body - MILD REACTIVE GASTROPATHY. [place] IS NEGATIVE FOR HELICOBACTER PYLORI. - NO INTESTINAL METAPLASIA, DYSPLASIA, OR MALIGNANCY. 3. Surgical [P], distal esophagus at 35 cm - REFLUX CHANGES. - NO INTESTINAL METAPLASIA, DYSPLASIA, OR MALIGNANCY. 4. Surgical [P], random esophagus sites - BENIGN SQUAMOUS MUCOSA. - NO INCREASE IN INTRAEPITHELIAL EOSINOPHILS. - NO INTESTINAL METAPLASIA, DYSPLASIA, OR MALIGNANCY. 5. Surgical [P], colon, descending, polyp (1) - HYPERPLASTIC POLYP. - NO DYSPLASIA OR MALIGNANCY.  Laboratory Studies  Reviewed those in epic and care everywhere  Imaging Studies  September 2022 CT abdomen pelvis with contrast IMPRESSION: No acute findings in the abdomen or pelvis. Cholelithiasis.   ASSESSMENT  Ms. Top is a 50 y.o. 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  The patient is seen today for evaluation and management of:  1. Gastroesophageal reflux disease, unspecified whether esophagitis present   2. Bloating   3. Irritable bowel syndrome, unspecified type   4. Hiatal  hernia   5. LLQ pain    The patient is hemodynamically stable.  From a clinical standpoint, when we get her bowels moving more frequently she has significant improvement in many of her symptoms especially with bloating and nausea.  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  However, possibility of SIBO needs to be considered and we will give her  a round of empiric Xifaxan and of trying to see if that will help with some of her issues as well.  The discomfort in her left lower quadrant seems to be more likely musculoskeletal in origin as even the passage of bowel movements does not help with that consistently.  I want to try the patient on Trulance or Amitiza in an effort of seeing if something may be a little bit easier on her rather than the Linzess otherwise we will have to continue the [place].  We are going to transition her PPI to Nexium to see if she will have any benefit from the pyrosis perspective although her hiatal hernia may be exacerbating some of her symptoms as well.  I do not think that hiatal hernia surgery and fundoplication are in her best needs at this time but we will see.  I suspect many of her symptoms as well are exacerbated by her mental health disorder and she will continue to work with her psychiatrist.  There may be a role of potentially adding a TCA but since she is already on multiple mental health medications, I would want the opportunity for psychiatrist to review my note and consider addition of a TCA only if it was felt to be safe.  All patient questions were answered to the best of my ability, and the patient agrees to the aforementioned plan of action with follow-up as indicated.   PLAN  Xifaxan 550 mg twice daily x10 days - If to expensive then we will consider overt SIBO breath testing versus trying to get samples for her in the future Transition to Nexium 40 mg daily Trulance samples for 10 days if more effective for the patient we can transition to  Trulance Amitiza samples were not available but could consider that as well Otherwise Linzess 145 mcg to 290 mcg daily versus alternating in effort of trying to have bowel movement on a more frequent basis As needed Zofran Fecal elastase testing to be performed She will call us back to get [place] psychiatrist information - Consideration of whether TCA could ever be added to her regimen will need to be considered by psychiatry colleagues   Orders Placed This Encounter  Procedures   Pancreatic elastase, fecal     New Prescriptions   ESOMEPRAZOLE (NEXIUM) 40 MG CAPSULE    Take 1 capsule (40 mg total) by mouth daily before breakfast.   RIFAXIMIN (XIFAXAN) 550 MG TABS TABLET    Take 1 tablet (550 mg total) by mouth 2 (two) times daily.   Modified Medications   No medications on file    Planned Follow Up No follow-ups on file.   Total Time in Face-to-Face and in Coordination of Care for patient including independent/personal interpretation/review of prior testing, medical history, examination, medication adjustment, communicating results with the patient directly, and documentation with the EHR is 25 minutes.   Justice Britain, MD [facility] Office # [phone]

## 2021-04-11 NOTE — Patient Instructions (Addendum)
Try samples of Trulance - Take 1 tablet by mouth once daily. If Trulance works for you then please call or send my chart message and we will send prescription to your pharmacy.   Stop Pantoprazole.   Start Nexium 40mg  - Take 1 capsule by mouth once daily.  Start Fiber Con once daily.   We have sent the following medications to your pharmacy for you to pick up at your convenience: [place] to [place]   Please let us know the name of your Psychiatrist  Your provider has requested that you go to the basement level for lab work before leaving today. Press "B" on the elevator. The lab is located at the first door on the left as you exit the elevator.   If you are age 49 or younger, your body mass index should be between 19-25. Your Body mass index is 25.06 kg/m. If this is out of the aformentioned range listed, please consider follow up with your Primary Care Provider.   ________________________________________________________  The [facility] providers would like to encourage you to use [facility] to communicate with providers for non-urgent requests or questions.  Due to long hold times on the telephone, sending your provider a message by [facility] may be a faster and more efficient way to get a response.  Please allow 48 business hours for a response.  Please remember that this is for non-urgent requests.  _______________________________________________________   Please keep follow-up on: 05/30/21 @ 2:10pm  Thank you for choosing me and [facility].  Dr. Rush Landmark

## 2021-04-12 DIAGNOSIS — K449 Diaphragmatic hernia without obstruction or gangrene: Secondary | ICD-10-CM | POA: Insufficient documentation

## 2021-04-12 DIAGNOSIS — R1032 Left lower quadrant pain: Secondary | ICD-10-CM | POA: Insufficient documentation

## 2021-04-12 DIAGNOSIS — K589 Irritable bowel syndrome without diarrhea: Secondary | ICD-10-CM | POA: Insufficient documentation

## 2021-04-17 ENCOUNTER — Encounter: Payer: Self-pay | Admitting: Gastroenterology

## 2021-04-17 NOTE — Progress Notes (Signed)
[name] [phone] [address], [address] [place], [place]. Leanord Hawking and ask for Ms. Kerry Travis  If any information needs to be sent to her psychiatrist.

## 2021-04-30 ENCOUNTER — Telehealth: Payer: Self-pay | Admitting: Gastroenterology

## 2021-04-30 NOTE — Telephone Encounter (Signed)
[place] called in. Need prior auth KEY: [id number]

## 2021-04-30 NOTE — Telephone Encounter (Signed)
PA has been sent via covermy meds.

## 2021-04-30 NOTE — Telephone Encounter (Signed)
Inbound call from patient states she need prior auth for xifaxan

## 2021-05-03 ENCOUNTER — Other Ambulatory Visit: Payer: Self-pay | Admitting: Gastroenterology

## 2021-05-30 ENCOUNTER — Ambulatory Visit: Payer: Medicaid Other | Admitting: Gastroenterology

## 2021-07-01 ENCOUNTER — Other Ambulatory Visit: Payer: Self-pay | Admitting: Gastroenterology

## 2021-07-23 ENCOUNTER — Other Ambulatory Visit: Payer: Self-pay | Admitting: Gastroenterology

## 2021-08-07 ENCOUNTER — Other Ambulatory Visit: Payer: Self-pay | Admitting: Gastroenterology

## 2022-07-19 IMAGING — CT CT ABD-PELV W/ CM
2 of 5 series · 16 of 46 positions shown, 18 images · IV contrast (APPLIED)
Comparison: None.

CLINICAL DATA: Left lower quadrant pain.  Chronic constipation.

EXAM:
CT ABDOMEN AND PELVIS WITH CONTRAST
TECHNIQUE: Multidetector CT imaging of the abdomen and pelvis was performed
using the standard protocol following bolus administration of
intravenous contrast.
CONTRAST:  80mL OMNIPAQUE IOHEXOL 350 MG/ML SOLN

[Series 2: axial st · axial · 0.76mm/px · z∈[-612,-236]mm · 13 of 89 slices shown, 15 images]
[im 7/89  soft-tissue]
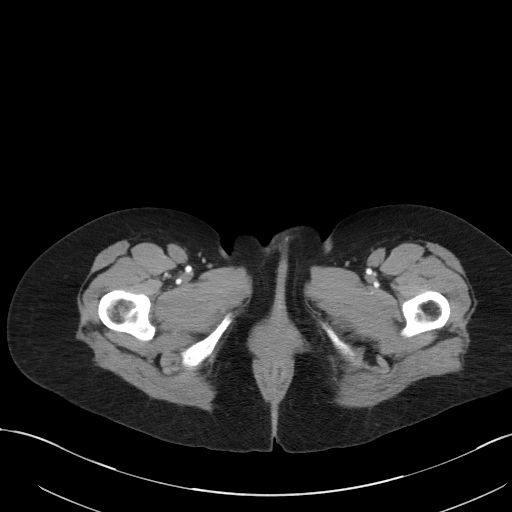
[im 7/89  bone]
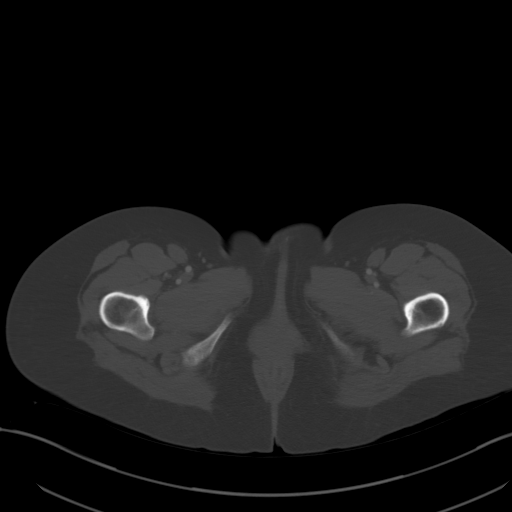
[im 13/89  soft-tissue]
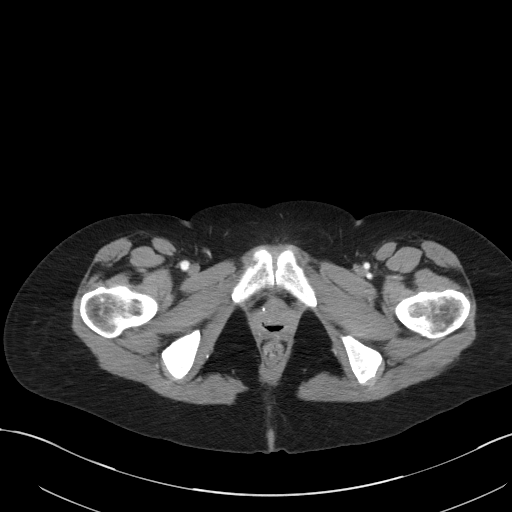
[im 19/89  soft-tissue]
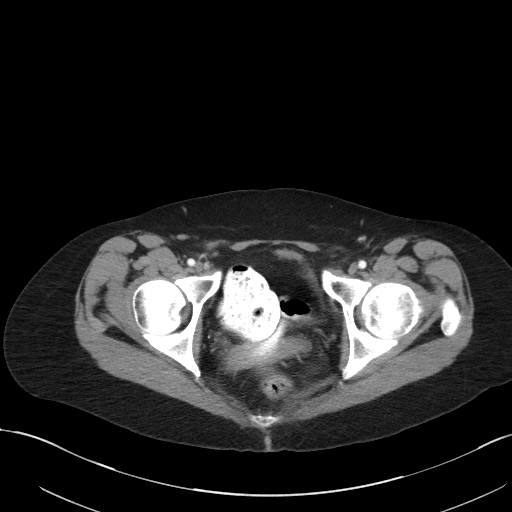
[im 26/89  soft-tissue]
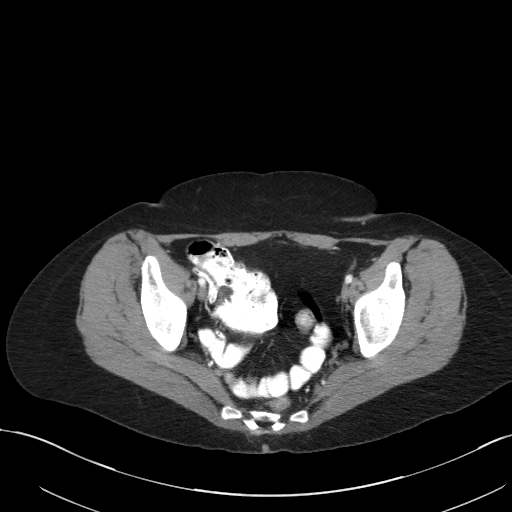
[im 32/89  soft-tissue]
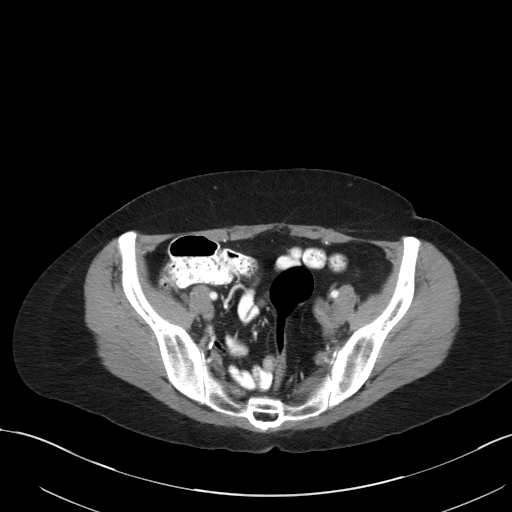
[im 38/89  soft-tissue]
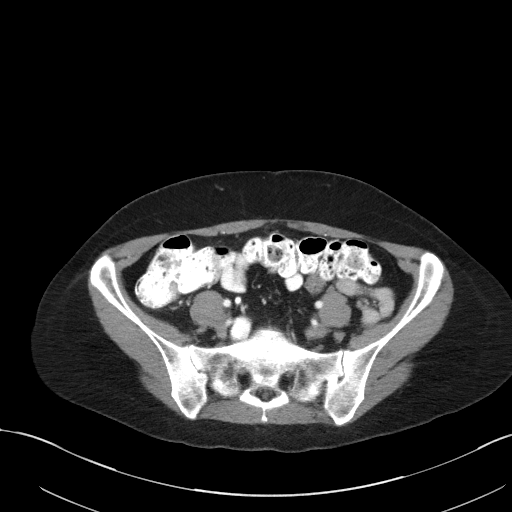
[im 45/89  soft-tissue]
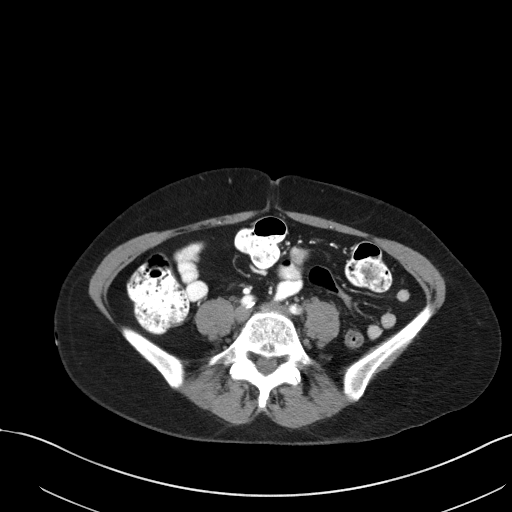
[im 51/89  soft-tissue]
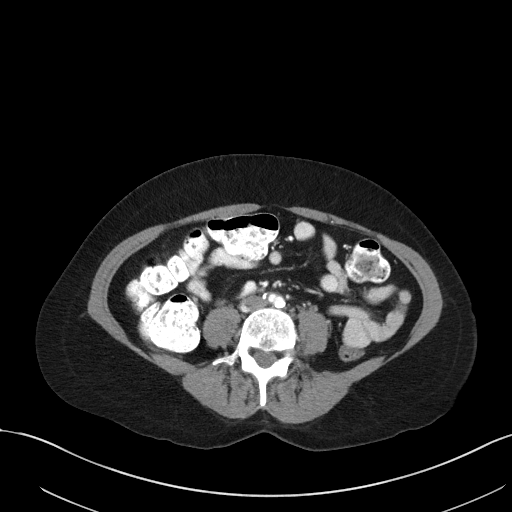
[im 57/89  soft-tissue]
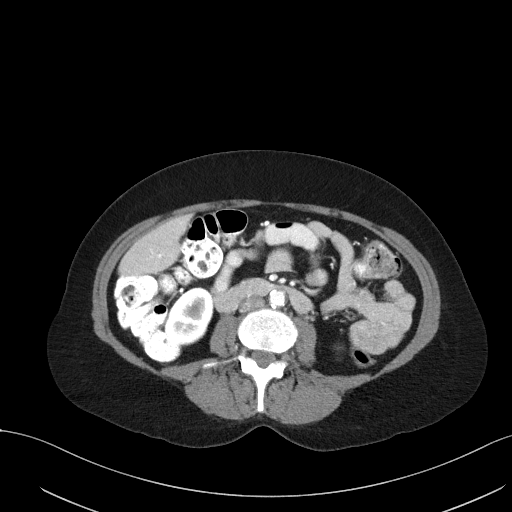
[im 57/89  bone]
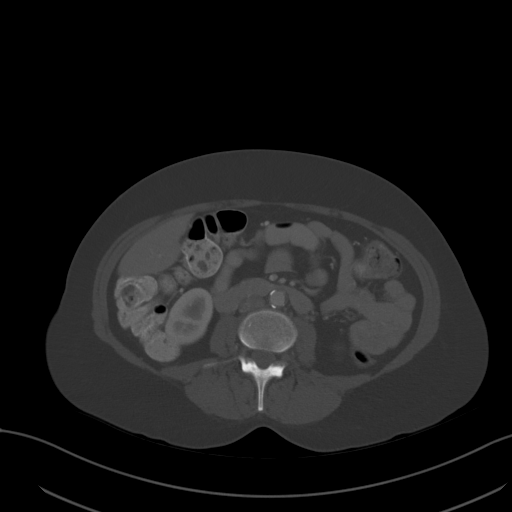
[im 63/89  soft-tissue]
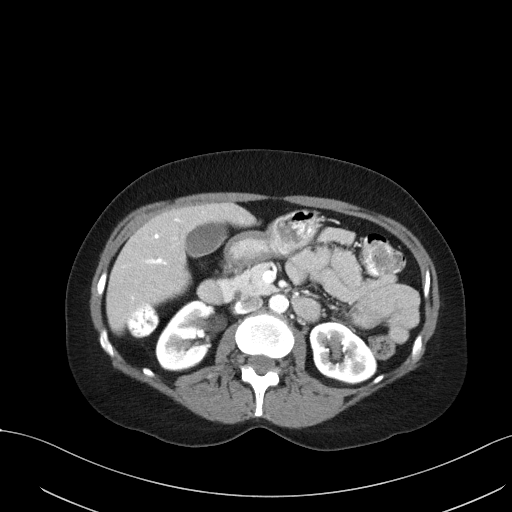
[im 70/89  soft-tissue]
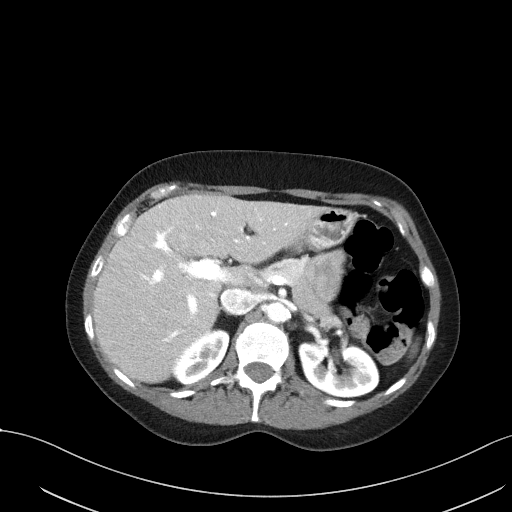
[im 76/89  soft-tissue]
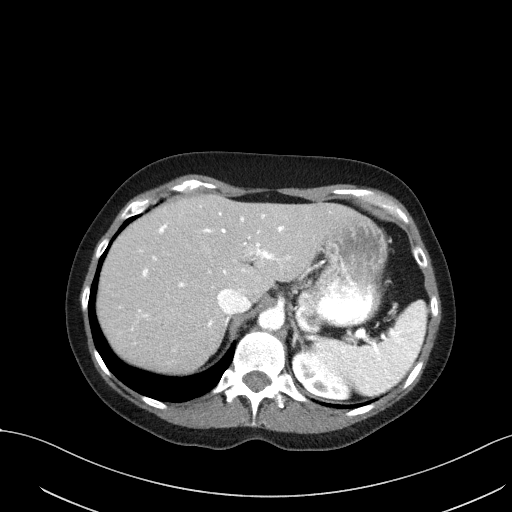
[im 82/89  soft-tissue]
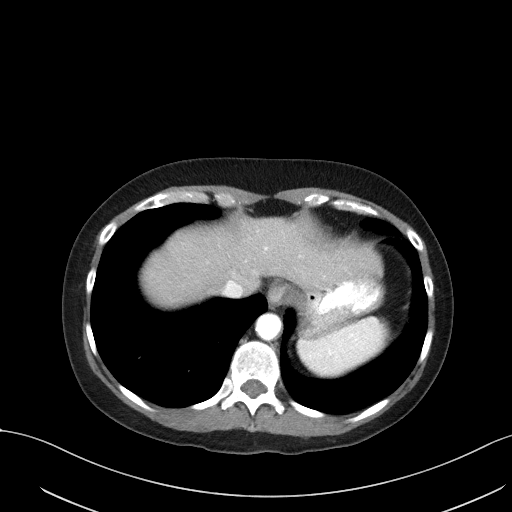

[Series 5: coronal st · coronal · 0.68mm/px · 3 of 73 slices shown]
[im 25/73  soft-tissue]
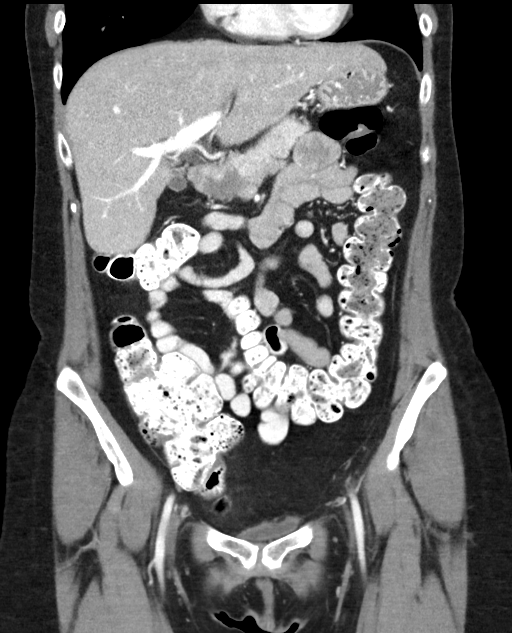
[im 33/73  soft-tissue]
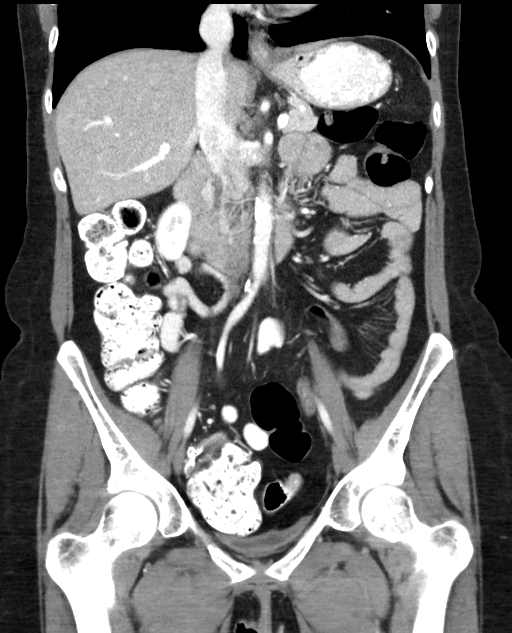
[im 41/73  soft-tissue]
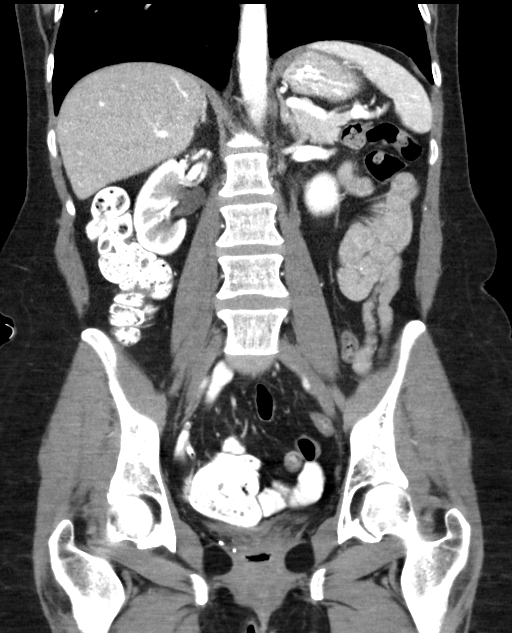

[16 of 46 positions shown; findings below may reference images not displayed]

FINDINGS: Lower chest: No acute abnormality.

Hepatobiliary: Small layering high-density material in the
gallbladder, likely small stones. No evidence of cholecystitis. No
focal hepatic abnormality.

Pancreas: No focal abnormality or ductal dilatation.

Spleen: No focal abnormality.  Normal size.

Adrenals/Urinary Tract: No adrenal abnormality. No focal renal
abnormality. No stones or hydronephrosis. Urinary bladder is
unremarkable.

Stomach/Bowel: Stomach, large and small bowel grossly unremarkable.
No evidence of diverticulosis or diverticulitis. Normal appendix.

Vascular/Lymphatic: Aortic atherosclerosis. No evidence of aneurysm
or adenopathy.

Reproductive: Prior hysterectomy.  No adnexal masses.

Other: No free fluid or free air.

Musculoskeletal: No acute bony abnormality.
IMPRESSION: No acute findings in the abdomen or pelvis.

Cholelithiasis.

## 2023-03-06 ENCOUNTER — Other Ambulatory Visit: Payer: Self-pay | Admitting: Family

## 2023-03-06 DIAGNOSIS — Z122 Encounter for screening for malignant neoplasm of respiratory organs: Secondary | ICD-10-CM

## 2023-03-07 ENCOUNTER — Encounter: Payer: Self-pay | Admitting: Internal Medicine

## 2023-03-10 ENCOUNTER — Encounter: Payer: Self-pay | Admitting: Family

## 2023-03-21 ENCOUNTER — Ambulatory Visit
Admission: RE | Admit: 2023-03-21 | Discharge: 2023-03-21 | Disposition: A | Discharge status: Home / Self Care | Payer: Medicaid Other | Source: Ambulatory Visit | Attending: Family | Admitting: Family

## 2023-03-21 DIAGNOSIS — Z122 Encounter for screening for malignant neoplasm of respiratory organs: Secondary | ICD-10-CM

## 2023-05-13 ENCOUNTER — Ambulatory Visit: Payer: Medicaid Other | Attending: Cardiovascular Disease | Admitting: Cardiovascular Disease
